# Patient Record
Sex: Male | Born: 1960 | Race: White | Hispanic: No | Marital: Married | Smoking: Never smoker
Health system: Southern US, Community
[De-identification: ages and names within clinical notes are randomized; demographics above are authoritative.]

## PROBLEM LIST (undated history)

## (undated) DIAGNOSIS — C189 Malignant neoplasm of colon, unspecified: Secondary | ICD-10-CM

## (undated) DIAGNOSIS — I10 Essential (primary) hypertension: Secondary | ICD-10-CM

## (undated) DIAGNOSIS — L723 Sebaceous cyst: Secondary | ICD-10-CM

## (undated) HISTORY — DX: Essential (primary) hypertension: I10

## (undated) HISTORY — DX: Malignant neoplasm of colon, unspecified: C18.9

## (undated) HISTORY — DX: Sebaceous cyst: L72.3

## (undated) HISTORY — PX: OTHER SURGICAL HISTORY: SHX169

---

## 1990-03-27 HISTORY — PX: COLON SURGERY: SHX602

## 1998-03-27 HISTORY — PX: KNEE SURGERY: SHX244

## 2008-06-16 ENCOUNTER — Encounter (INDEPENDENT_AMBULATORY_CARE_PROVIDER_SITE_OTHER): Payer: Self-pay | Admitting: *Deleted

## 2009-05-28 ENCOUNTER — Telehealth: Payer: Self-pay | Admitting: Gastroenterology

## 2009-06-08 ENCOUNTER — Encounter (INDEPENDENT_AMBULATORY_CARE_PROVIDER_SITE_OTHER): Payer: Self-pay | Admitting: *Deleted

## 2009-06-08 ENCOUNTER — Ambulatory Visit: Payer: Self-pay | Admitting: Gastroenterology

## 2009-07-23 ENCOUNTER — Telehealth (INDEPENDENT_AMBULATORY_CARE_PROVIDER_SITE_OTHER): Payer: Self-pay | Admitting: *Deleted

## 2009-07-27 ENCOUNTER — Ambulatory Visit: Payer: Self-pay | Admitting: Gastroenterology

## 2010-04-26 NOTE — Letter (Signed)
Summary: [facility] Instructions  [facility]  [address] [place], [place] [address]   Phone: [phone]  Fax: [phone]       Paul [name]    27-Dec-1960    MRN: [id number]        Procedure Day Dorna Bloom:  Jake Shark  07/06/09     Arrival Time:  9:00AM     Procedure Time:  10:00AM     Location of Procedure:                    _ X_  [facility] (4th Floor)                        PREPARATION FOR COLONOSCOPY WITH MOVIPREP   Starting 5 days prior to your procedure 07/01/09 do not eat nuts, seeds, popcorn, corn, beans, peas,  salads, or any raw vegetables.  Do not take any fiber supplements (e.g. Metamucil, Citrucel, and Benefiber).  THE DAY BEFORE YOUR PROCEDURE         DATE: 07/05/09  DAY: MONDAY  1.  Drink clear liquids the entire day-NO SOLID FOOD  2.  Do not drink anything colored red or purple.  Avoid juices with pulp.  No orange juice.  3.  Drink at least 64 oz. (8 glasses) of fluid/clear liquids during the day to prevent dehydration and help the prep work efficiently.  CLEAR LIQUIDS INCLUDE: Water Jello Ice Popsicles Tea (sugar ok, no milk/cream) Powdered fruit flavored drinks Coffee (sugar ok, no milk/cream) Gatorade Juice: apple, white grape, white cranberry  Lemonade Clear bullion, consomm, broth Carbonated beverages (any kind) Strained chicken noodle soup Hard Candy                             4.  In the morning, mix first dose of MoviPrep solution:    Empty 1 Pouch A and 1 Pouch B into the disposable container    Add lukewarm drinking water to the top line of the container. Mix to dissolve    Refrigerate (mixed solution should be used within 24 hrs)  5.  Begin drinking the prep at 5:00 p.m. The MoviPrep container is divided by 4 marks.   Every 15 minutes drink the solution down to the next mark (approximately 8 oz) until the full liter is complete.   6.  Follow completed prep with 16 oz of clear liquid of your choice (Nothing  red or purple).  Continue to drink clear liquids until bedtime.  7.  Before going to bed, mix second dose of MoviPrep solution:    Empty 1 Pouch A and 1 Pouch B into the disposable container    Add lukewarm drinking water to the top line of the container. Mix to dissolve    Refrigerate  THE DAY OF YOUR PROCEDURE      DATE: 07/06/09  DAY: TUESDAY  Beginning at 5:00AM (5 hours before procedure):         1. Every 15 minutes, drink the solution down to the next mark (approx 8 oz) until the full liter is complete.  2. Follow completed prep with 16 oz. of clear liquid of your choice.    3. You may drink clear liquids until 8:00AM (2 HOURS BEFORE PROCEDURE).   MEDICATION INSTRUCTIONS  Unless otherwise instructed, you should take regular prescription medications with a small sip of water   as early as possible the morning  of your procedure.   Additional medication instructions: TAKE YOUR LISINOPRIL THE MORNING OF PROCEDURE.         OTHER INSTRUCTIONS  You will need a responsible adult at least 50 years of age to accompany you and drive you home.   This person must remain in the waiting room during your procedure.  Wear loose fitting clothing that is easily removed.  Leave jewelry and other valuables at home.  However, you may wish to bring a book to read or  an iPod/MP3 player to listen to music as you wait for your procedure to start.  Remove all body piercing jewelry and leave at home.  Total time from sign-in until discharge is approximately 2-3 hours.  You should go home directly after your procedure and rest.  You can resume normal activities the  day after your procedure.  The day of your procedure you should not:   Drive   Make legal decisions   Operate machinery   Drink alcohol   Return to work  You will receive specific instructions about eating, activities and medications before you leave.    The above instructions have been reviewed and explained to  me by   Wyona Almas RN  June 08, 2009 4:15 PM     I fully understand and can verbalize these instructions _____________________________ Date _________

## 2010-04-26 NOTE — Miscellaneous (Signed)
Summary: [facility] Previsit/prep  Clinical Lists Changes  Medications: Added new medication of MOVIPREP 100 GM  SOLR (PEG-KCL-NACL-NASULF-NA ASC-C) As per prep instructions. - Signed Rx of MOVIPREP 100 GM  SOLR (PEG-KCL-NACL-NASULF-NA ASC-C) As per prep instructions.;  #1 x 0;  Signed;  Entered by: Wyona Almas RN;  Authorized by: Louis Meckel MD;  Method used: Electronically to [facility] [phone]*, [address], [place], [place]  [address], Ph: [phone] or [phone], Fax: [phone] Observations: Added new observation of NKA: T (06/08/2009 15:51)    Prescriptions: MOVIPREP 100 GM  SOLR (PEG-KCL-NACL-NASULF-NA ASC-C) As per prep instructions.  #1 x 0   Entered by:   Wyona Almas RN   Authorized by:   Louis Meckel MD   Signed by:   Wyona Almas RN on 06/08/2009   Method used:   [facility]ronically to        [facility] [phone]* (retail)       [address]       [place], [place]  [address]       Ph: [phone] or [phone]       Fax: [phone]   RxID:   [id number]

## 2010-04-26 NOTE — Progress Notes (Signed)
Summary: Questions about Prep  Phone Note Call from Patient Call back at [phone]   Caller: Patient Call For: Dr. Arlyce Dice Reason for Call: Talk to Nurse Summary of Call: pt wants to know if he should take all his meds before procedure and if he can start his Moviprep around 5:45 the day before Initial call taken by: Karna Christmas,  July 23, 2009 8:30 AM  Follow-up for Phone Call        pt's questions were answered about meds.  told pt it would be okay to start prep at 5:45. Follow-up by: Ezra Sites RN,  July 23, 2009 9:47 AM

## 2010-04-26 NOTE — Progress Notes (Signed)
Summary: Schedule recall colonoscopy  Phone Note Outgoing Call Call back at [facility] Phone [phone]   Call placed by: Christie Nottingham CMA Duncan Dull),  May 28, 2009 10:18 AM Call placed to: Patient Summary of Call: Left message for pt to call back and schedule recall colonoscopy.  Initial call taken by: Christie Nottingham CMA Duncan Dull),  May 28, 2009 10:18 AM  Follow-up for Phone Call        left message for pt  to call back  Follow-up by: Christie Nottingham CMA Duncan Dull),  June 04, 2009 9:15 AM     Appended Document: Schedule recall colonoscopy patient scheduled for 06/17/2009, previsit on 06/08/2009 went over everything on the phone too soon to mail a letter.

## 2010-04-26 NOTE — Procedures (Signed)
Summary: Colonoscopy  Patient: Paul [name] Note: All result statuses are Final unless otherwise noted.  Tests: (1) Colonoscopy (COL)   COL Colonoscopy           DONE     [facility]     [address]     [place], [place]  [address]           COLONOSCOPY PROCEDURE REPORT           PATIENT:  Paul [name], Paul [name]  MR#:  [id number]     BIRTHDATE:  08-21-1960, 48 yrs. old  GENDER:  male           ENDOSCOPIST:  Barbette Hair. Arlyce Dice, MD     Referred by:           PROCEDURE DATE:  07/27/2009     PROCEDURE:  Diagnostic Colonoscopy     ASA CLASS:  Class II     INDICATIONS:  1) screening  2) history of colon cancer           MEDICATIONS:   Fentanyl 100 mcg IV, Versed 10 mg IV           DESCRIPTION OF PROCEDURE:   After the risks benefits and     alternatives of the procedure were thoroughly explained, informed     consent was obtained.  Digital rectal exam was performed and     revealed no abnormalities.   The LB CF-[id number] endoscope     was introduced through the anus and advanced to the anastamosis,     without limitations.  The quality of the prep was excellent, using     MoviPrep.  The instrument was then slowly withdrawn as the colon     was fully examined.     <<PROCEDUREIMAGES>>           FINDINGS:  A normal appearing cecum, ileocecal valve, and     appendiceal orifice were identified. The ascending, hepatic     flexure, transverse, splenic flexure, descending, sigmoid colon,     and rectum appeared unremarkable (see image2, image3, image5,     image6, image7, image8, image9, and image11).   Retroflexed views     in the rectum revealed no abnormalities.    The time to     anastamosis =  2.5  minutes. The scope was then withdrawn (time =     6.45  min) from the patient and the procedure completed.           COMPLICATIONS:  None           ENDOSCOPIC IMPRESSION:     1) Normal colon 9s/p right hemicolectomy)     RECOMMENDATIONS:     1) Colonoscopy           REPEAT EXAM:   In 5 year(s) for Colonoscopy.           ______________________________     Barbette Hair. Arlyce Dice, MD           CC: Foye Deer, MD           n.     Rosalie Doctor:   Barbette Hair. Kaplan at 07/27/2009 10:54 AM           Marcie Bal, [id number]  Note: An exclamation mark (!) indicates a result that was not dispersed into the flowsheet. Document Creation Date: 07/27/2009 10:54 AM _______________________________________________________________________  (1) Order result status: Final Collection or observation date-time: 07/27/2009 10:48 Requested date-time:  Receipt date-time:  Reported date-time:  Referring Physician:   Ordering Physician: Melvia Heaps [phone]) Specimen Source:  Source: Launa Grill Order Number: [id number] Lab site:   Appended Document: Colonoscopy    Clinical Lists Changes  Observations: Added new observation of COLONNXTDUE: 07/2014 (07/27/2009 13:28)

## 2012-08-14 ENCOUNTER — Ambulatory Visit (INDEPENDENT_AMBULATORY_CARE_PROVIDER_SITE_OTHER): Payer: Managed Care, Other (non HMO) | Admitting: General Surgery

## 2012-09-05 ENCOUNTER — Encounter (INDEPENDENT_AMBULATORY_CARE_PROVIDER_SITE_OTHER): Payer: Self-pay | Admitting: General Surgery

## 2012-09-05 ENCOUNTER — Encounter (INDEPENDENT_AMBULATORY_CARE_PROVIDER_SITE_OTHER): Payer: Self-pay

## 2012-09-05 ENCOUNTER — Ambulatory Visit (INDEPENDENT_AMBULATORY_CARE_PROVIDER_SITE_OTHER): Payer: Managed Care, Other (non HMO) | Admitting: General Surgery

## 2012-09-05 VITALS — BP 124/78 | HR 72 | Temp 98.0°F | Resp 18 | Ht 66.0 in | Wt 170.0 lb

## 2012-09-05 DIAGNOSIS — L723 Sebaceous cyst: Secondary | ICD-10-CM

## 2012-09-05 NOTE — Progress Notes (Signed)
Chief complaint: Multiple scalp cysts  History: Patient is well known to me due to a large GIST the terminal ileum resected in 1996. He has done well from that surgery would notice recurrence. For a number of years he has had several cysts on his scalp that occasionally opened up and drained. They seem to be getting gradually larger. He interested in having these removed. No current infection or pain.  Past Medical History  Diagnosis Date  . Sebaceous cyst   . Hypertension    Past Surgical History  Procedure Laterality Date  . Colon surgery  1992  . Knee surgery  2000   Current Outpatient Prescriptions  Medication Sig Dispense Refill  . Aspirin-Acetaminophen-Caffeine (EXCEDRIN PO) Take by mouth as needed.      . cholestyramine (QUESTRAN) 4 GM/DOSE powder daily. 2 scoops daily      . fish oil-omega-3 fatty acids 1000 MG capsule Take 1 g by mouth daily.      . hydrochlorothiazide (MICROZIDE) 12.5 MG capsule Take 12.5 mg by mouth daily.      Marland Kitchen ibuprofen (ADVIL,MOTRIN) 200 MG tablet Take 200 mg by mouth every 6 (six) hours as needed for pain.      Marland Kitchen lisinopril (PRINIVIL,ZESTRIL) 20 MG tablet       . Multiple Vitamin (MULTIVITAMIN) tablet Take 1 tablet by mouth daily.       No current facility-administered medications for this visit.   No Known Allergies Exam: BP 124/78  Pulse 72  Temp(Src) 98 F (36.7 C) (Oral)  Resp 18  Ht 5\' 6"  (1.676 m)  Wt 170 lb (77.111 kg)  BMI 27.45 kg/m2 General: Well-appearing Caucasian male HEENT: He has several firm but typical feeling sebaceous cyst of the scalp. There is a 2 cm cyst on the right anterior the scalp, a 3.5 cm cyst on the left posterior scalp and a 1 cm cyst on the right posterior scalp. Lungs: Clear good breath sounds without wheezing Cardiac: Regular rate and rhythm without murmurs. No edema Abdomen: Well-healed midline incision. No hernias. No masses or tenderness.  Assessment and plan: Multiple sebaceous cysts of the scalp with  some enlargement and intermittent drainage. He would like these removed which I think would be best. We discussed use of local anesthesia with sedation and risks of bleeding and infection. We'll schedule this at his convenience.

## 2012-10-09 DIAGNOSIS — Q828 Other specified congenital malformations of skin: Secondary | ICD-10-CM

## 2012-10-09 DIAGNOSIS — L7211 Pilar cyst: Secondary | ICD-10-CM

## 2012-10-24 ENCOUNTER — Ambulatory Visit (INDEPENDENT_AMBULATORY_CARE_PROVIDER_SITE_OTHER): Payer: Managed Care, Other (non HMO) | Admitting: General Surgery

## 2012-10-24 ENCOUNTER — Encounter (INDEPENDENT_AMBULATORY_CARE_PROVIDER_SITE_OTHER): Payer: Self-pay | Admitting: General Surgery

## 2012-10-24 VITALS — BP 120/78 | HR 56 | Resp 16 | Ht 66.0 in | Wt 170.0 lb

## 2012-10-24 DIAGNOSIS — Z09 Encounter for follow-up examination after completed treatment for conditions other than malignant neoplasm: Secondary | ICD-10-CM

## 2012-10-24 NOTE — Progress Notes (Signed)
History: Patient returns following excision of 3 sebaceous cysts of the scalp. He reports no problems.  Exam: Incisions are all well healed without infection or other complication. Sutures are removed.  Assessment plan: Doing well following excision of multiple scalp sebaceous cysts as above. Return as needed.

## 2013-01-10 ENCOUNTER — Emergency Department (HOSPITAL_COMMUNITY)
Admission: EM | Admit: 2013-01-10 | Discharge: 2013-01-11 | Disposition: A | Discharge status: Home / Self Care | Payer: Managed Care, Other (non HMO) | Attending: Emergency Medicine | Admitting: Emergency Medicine

## 2013-01-10 ENCOUNTER — Encounter (HOSPITAL_COMMUNITY): Payer: Self-pay | Admitting: Emergency Medicine

## 2013-01-10 DIAGNOSIS — Z79899 Other long term (current) drug therapy: Secondary | ICD-10-CM | POA: Insufficient documentation

## 2013-01-10 DIAGNOSIS — Y9389 Activity, other specified: Secondary | ICD-10-CM | POA: Insufficient documentation

## 2013-01-10 DIAGNOSIS — I10 Essential (primary) hypertension: Secondary | ICD-10-CM | POA: Insufficient documentation

## 2013-01-10 DIAGNOSIS — IMO0002 Reserved for concepts with insufficient information to code with codable children: Secondary | ICD-10-CM | POA: Insufficient documentation

## 2013-01-10 DIAGNOSIS — R296 Repeated falls: Secondary | ICD-10-CM | POA: Insufficient documentation

## 2013-01-10 DIAGNOSIS — R55 Syncope and collapse: Secondary | ICD-10-CM | POA: Insufficient documentation

## 2013-01-10 DIAGNOSIS — Y92009 Unspecified place in unspecified non-institutional (private) residence as the place of occurrence of the external cause: Secondary | ICD-10-CM | POA: Insufficient documentation

## 2013-01-10 DIAGNOSIS — Z872 Personal history of diseases of the skin and subcutaneous tissue: Secondary | ICD-10-CM | POA: Insufficient documentation

## 2013-01-10 DIAGNOSIS — R42 Dizziness and giddiness: Secondary | ICD-10-CM | POA: Insufficient documentation

## 2013-01-10 DIAGNOSIS — I951 Orthostatic hypotension: Secondary | ICD-10-CM

## 2013-01-10 LAB — POCT I-STAT, CHEM 8
BUN: 13 mg/dL (ref 6–23)
Calcium, Ion: 1.23 mmol/L (ref 1.12–1.23)
Chloride: 101 mEq/L (ref 96–112)
Creatinine, Ser: 1.2 mg/dL (ref 0.50–1.35)
Glucose, Bld: 109 mg/dL — ABNORMAL HIGH (ref 70–99)
HCT: 37 % — ABNORMAL LOW (ref 39.0–52.0)
Hemoglobin: 12.6 g/dL — ABNORMAL LOW (ref 13.0–17.0)
Potassium: 3.4 mEq/L — ABNORMAL LOW (ref 3.5–5.1)
Sodium: 137 mEq/L (ref 135–145)
TCO2: 19 mmol/L (ref 0–100)

## 2013-01-10 NOTE — ED Notes (Addendum)
Pt was at a friend's house playing a game standing up, he bent at the waist and as he stood straight up he became dizzy, fell and lost consciousness for approximately 30 seconds per wife.  Pt states he had 3 beers this evening. A&Ox4.
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
## 2013-01-13 ENCOUNTER — Emergency Department (HOSPITAL_COMMUNITY): Payer: No Typology Code available for payment source

## 2013-01-13 ENCOUNTER — Emergency Department (HOSPITAL_COMMUNITY)
Admission: EM | Admit: 2013-01-13 | Discharge: 2013-01-13 | Disposition: A | Discharge status: Home / Self Care | Payer: No Typology Code available for payment source | Attending: Emergency Medicine | Admitting: Emergency Medicine

## 2013-01-13 ENCOUNTER — Encounter (HOSPITAL_COMMUNITY): Payer: Self-pay | Admitting: Emergency Medicine

## 2013-01-13 DIAGNOSIS — Y939 Activity, unspecified: Secondary | ICD-10-CM | POA: Insufficient documentation

## 2013-01-13 DIAGNOSIS — Z79899 Other long term (current) drug therapy: Secondary | ICD-10-CM | POA: Insufficient documentation

## 2013-01-13 DIAGNOSIS — Y92410 Unspecified street and highway as the place of occurrence of the external cause: Secondary | ICD-10-CM | POA: Insufficient documentation

## 2013-01-13 DIAGNOSIS — S20219A Contusion of unspecified front wall of thorax, initial encounter: Secondary | ICD-10-CM | POA: Insufficient documentation

## 2013-01-13 DIAGNOSIS — I10 Essential (primary) hypertension: Secondary | ICD-10-CM | POA: Insufficient documentation

## 2013-01-13 DIAGNOSIS — S161XXA Strain of muscle, fascia and tendon at neck level, initial encounter: Secondary | ICD-10-CM

## 2013-01-13 DIAGNOSIS — R209 Unspecified disturbances of skin sensation: Secondary | ICD-10-CM | POA: Insufficient documentation

## 2013-01-13 DIAGNOSIS — S139XXA Sprain of joints and ligaments of unspecified parts of neck, initial encounter: Secondary | ICD-10-CM | POA: Insufficient documentation

## 2013-01-13 MED ORDER — HYDROCODONE-ACETAMINOPHEN 5-325 MG PO TABS: 1.0000 | ORAL_TABLET | Freq: Four times a day (QID) | ORAL | Status: DC | PRN

## 2013-01-13 MED ORDER — IBUPROFEN 800 MG PO TABS
800.0000 mg | ORAL_TABLET | Freq: Once | ORAL | Status: AC
  Administered 2013-01-13: 800 mg via ORAL
  Filled 2013-01-13: qty 1

## 2013-01-13 NOTE — ED Notes (Signed)
MD Plunkett at bedside. 
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
## 2013-01-13 NOTE — ED Notes (Signed)
Patient transported to X-ray 
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
## 2013-01-13 NOTE — ED Notes (Signed)
Pt discharged home, alert and ambulatory upon discharge, 1 new RX prescribed, pt verbalizes understanding of discharge instructions, pt driven home by spouse, no narcotics given in ED

## 2013-01-13 NOTE — ED Notes (Signed)
Pt returned from XR and CT, placed back on continuous pulse ox and BP cycle every 30 mins, NAD

## 2013-10-28 ENCOUNTER — Encounter: Payer: Self-pay | Admitting: Gastroenterology

## 2014-06-12 ENCOUNTER — Encounter: Payer: Self-pay | Admitting: *Deleted

## 2014-06-22 ENCOUNTER — Encounter: Payer: Self-pay | Admitting: Gastroenterology

## 2015-03-16 ENCOUNTER — Ambulatory Visit (INDEPENDENT_AMBULATORY_CARE_PROVIDER_SITE_OTHER)
Admission: RE | Admit: 2015-03-16 | Discharge: 2015-03-16 | Disposition: A | Discharge status: Home / Self Care | Payer: Managed Care, Other (non HMO) | Source: Ambulatory Visit | Attending: Family Medicine | Admitting: Family Medicine

## 2015-03-16 ENCOUNTER — Other Ambulatory Visit (INDEPENDENT_AMBULATORY_CARE_PROVIDER_SITE_OTHER): Payer: Managed Care, Other (non HMO)

## 2015-03-16 ENCOUNTER — Ambulatory Visit (INDEPENDENT_AMBULATORY_CARE_PROVIDER_SITE_OTHER): Payer: Managed Care, Other (non HMO) | Admitting: Family Medicine

## 2015-03-16 ENCOUNTER — Encounter: Payer: Self-pay | Admitting: Family Medicine

## 2015-03-16 VITALS — BP 128/80 | HR 78 | Ht 65.0 in | Wt 167.0 lb

## 2015-03-16 DIAGNOSIS — M25561 Pain in right knee: Secondary | ICD-10-CM

## 2015-03-16 DIAGNOSIS — M25461 Effusion, right knee: Secondary | ICD-10-CM | POA: Diagnosis not present

## 2015-03-16 NOTE — Progress Notes (Signed)
[name] [place], [place] [address] Phone: [phone] Subjective:    I'm seeing this patient by the request  of:  Gennette Pac, MD   CC: Right knee pain  [id number] Paul [name] is a 54 y.o. male coming in with complaint of right knee pain. Patient was running over the Thanksgiving weekend. States that he did have a step [place] causing discomfort in his knee. Had significant amount pain for the next 72 hours where he had decreased range of motion. Now patient has been able to do more activity. Continues to workout as well as running 2-3 times a week but does have discomfort. Seems to be worse when he is going downstairs. Denies any giving out on him. Denies any radiation or weakness. Rates the severity of pain a 6 out of 10. Does respond well to anti-inflammatories orally.  Past Medical History  Diagnosis Date  . Sebaceous cyst   . Hypertension   . Colon cancer ([place])     1992   Past Surgical History  Procedure Laterality Date  . Colon surgery  1992  . Knee surgery  2000  . Tumor removed     Social History  Substance Use Topics  . Smoking status: Never Smoker   . Smokeless tobacco: None  . Alcohol Use: 1.8 oz/week    3 Cans of beer per week     Comment: per week   No Known Allergies No family history on file. nothing pertinent.      Past medical history, social, surgical and family history all reviewed in electronic medical record.   Review of Systems: No headache, visual changes, nausea, vomiting, diarrhea, constipation, dizziness, abdominal pain, skin rash, fevers, chills, night sweats, weight loss, swollen lymph nodes, body aches, joint swelling, muscle aches, chest pain, shortness of breath, mood changes.   Objective Blood pressure 128/80, pulse 78, height 5\' 5"  (1.651 m), weight 167 lb (75.751 kg), SpO2 99 %.  General: No apparent distress alert and oriented x3 mood and affect normal, dressed  appropriately.  HEENT: Pupils equal, extraocular movements intact  Respiratory: Patient's speak in full sentences and does not appear short of breath  Cardiovascular: No lower extremity edema, non tender, no erythema  Skin: Warm dry intact with no signs of infection or rash on extremities or on axial skeleton.  Abdomen: Soft nontender  Neuro: Cranial nerves II through XII are intact, neurovascularly intact in all extremities with 2+ DTRs and 2+ pulses.  Lymph: No lymphadenopathy of posterior or anterior cervical chain or axillae bilaterally.  Gait normal with good balance and coordination.  MSK:  Non tender with full range of motion and good stability and symmetric strength and tone of shoulders, elbows, wrist, hip, and ankles bilaterally.  Knee: Right knee Normal to inspection with no erythema or effusion or obvious bony abnormalities. Mild tenderness to palpation over the lateral joint line as well as over the superior lateral aspect of the patella ROM full in flexion and extension and lower leg rotation. Ligaments with solid consistent endpoints including ACL, PCL, LCL, MCL. Equivocal Mcmurray's, Apley's, and Thessalonian tests. Mild painful patellar compression. Patellar glide with moderate crepitus. Patellar and quadriceps tendons unremarkable. Hamstring and quadriceps strength is normal.  Contralateral knee unremarkable  MSK US performed of: right knee.  This study was ordered, performed, and interpreted by Charlann Boxer D.O.  Knee: All structures visualized. Patient does have a posterior lateral meniscectomy with post surgical changes.  Patient does have narrowing of the lateral joint line. Patient also has a knee effusion of the suprapatellar pouch. Questionable loose body seen in the suprapatellar pouch.  Patellar Tendon unremarkable on long and transverse views without effusion. No abnormality of prepatellar bursa. LCL and MCL unremarkable on long and transverse views. No  abnormality of origin of medial or lateral head of the gastrocnemius.  IMPRESSION:  Knee effusion with synovits.      Impression and Recommendations:     This case required medical decision making of moderate complexity.

## 2015-03-16 NOTE — Assessment & Plan Note (Signed)
Patient did have a trial of aspiration today. Patient had more of a synovitis though noted. Unable to get significant amount of fluid out. Patient did respond well to the injection. We discussed home exercises, icing, as well as oral anti-inflammatories for the next 6 days. We discussed with patient that differential includes patient's underlying arthritis secondary to his remote history of meniscectomy when he is a teenager, and more likely a patellar subluxation. I do not see any new signs of any new meniscal injury but with patient having a synovitis we'll consider further workup if he does not make improvement. Patient given a brace to wear with activity. Patient and will come back and see me again in 3-4 weeks. Depending on that we will discuss formal physical therapy versus possible advance imaging.

## 2015-03-16 NOTE — Progress Notes (Signed)
Pre visit review using our clinic review tool, if applicable. No additional management support is needed unless otherwise documented below in the visit note. 

## 2015-03-16 NOTE — Patient Instructions (Signed)
Good to see you Ice 20 minutes 2 times daily. Usually after activity and before bed. Duexis 3 times daily for 6 days.  Wear brace with activity or exercise.  Vitamin D 2000 IU daily Turmeric 500mg  twice daily Exercises 3 times a week.  Xrays of right knee today in the basement  See me again in 3-4 weeks.

## 2015-04-05 ENCOUNTER — Encounter: Payer: Self-pay | Admitting: Family Medicine

## 2015-04-05 ENCOUNTER — Other Ambulatory Visit (INDEPENDENT_AMBULATORY_CARE_PROVIDER_SITE_OTHER): Payer: Managed Care, Other (non HMO)

## 2015-04-05 ENCOUNTER — Ambulatory Visit (INDEPENDENT_AMBULATORY_CARE_PROVIDER_SITE_OTHER): Payer: Managed Care, Other (non HMO) | Admitting: Family Medicine

## 2015-04-05 VITALS — BP 136/84 | HR 65 | Ht 65.0 in | Wt 167.0 lb

## 2015-04-05 DIAGNOSIS — M25461 Effusion, right knee: Secondary | ICD-10-CM | POA: Diagnosis not present

## 2015-04-05 DIAGNOSIS — M129 Arthropathy, unspecified: Secondary | ICD-10-CM | POA: Diagnosis not present

## 2015-04-05 DIAGNOSIS — M1711 Unilateral primary osteoarthritis, right knee: Secondary | ICD-10-CM

## 2015-04-05 NOTE — Patient Instructions (Signed)
Good to see you Ice is your friend  Keep doing the exercises 2-3 times a week Start running 1-2 times a week for 2 weeks then up too you how much.  I do like lower impact exercises as well OK to do boot camp but drop weight 50% and increase 15% a week See me again in 6 weeks if not perfect

## 2015-04-05 NOTE — Assessment & Plan Note (Signed)
Results at this time. We discussed icing regimen and home exercises. We discussed which activities to do an which was potentially avoid. Patient given an exercise prescription and a return to running protocol. Patient will avoid certain activities such as twisting motions for another 6 weeks. Continuing to wear the brace with running for another 6 weeks. Patient will come back and see me again in 6 weeks if pain is not completely resolved. Underlying arthritis could give him flares from time to time.

## 2015-04-05 NOTE — Assessment & Plan Note (Signed)
Patient does have moderate narrowing of the lateral joint line. We will need to monitor this over time. Patient will continue with the conservative therapy including vitamin D supplementation.

## 2015-04-05 NOTE — Progress Notes (Signed)
[name] [place], [place] [address] Phone: [phone] Subjective:      CC: Right knee pain f/u  [id number] Paul [name] is a 55 y.o. male coming in with complaint of right knee pain. Patient was running over the Thanksgiving weekend. Had swelling of the right knee. X-rays show that patient had moderate arthritis. These images were independently visualized again by me today. Patient elected do home exercises, icing, anti-inflammatories. Patient states he is a proximal milling 90% better. Patient states that he is no longer having the severe pain anymore. States that the swelling seemed to resolve after the first 1-2 weeks. Patient continues to wear the brace fairly regularly. Avoid any running but has been doing the elliptical as well as the biking. No pain with that activity. Continues to wear the brace.  Past Medical History  Diagnosis Date  . Sebaceous cyst   . Hypertension   . Colon cancer ([place])     1992   Past Surgical History  Procedure Laterality Date  . Colon surgery  1992  . Knee surgery  2000  . Tumor removed     Social History  Substance Use Topics  . Smoking status: Never Smoker   . Smokeless tobacco: Not on file  . Alcohol Use: 1.8 oz/week    3 Cans of beer per week     Comment: per week   No Known Allergies No family history on file. nothing pertinent as related to the chief complaint      Past medical history, social, surgical and family history all reviewed in electronic medical record.   Review of Systems: No headache, visual changes, nausea, vomiting, diarrhea, constipation, dizziness, abdominal pain, skin rash, fevers, chills, night sweats, weight loss, swollen lymph nodes, body aches, joint swelling, muscle aches, chest pain, shortness of breath, mood changes.   Objective There were no vitals taken for this visit.  General: No apparent distress alert and oriented x3 mood and affect normal, dressed  appropriately.  HEENT: Pupils equal, extraocular movements intact  Respiratory: Patient's speak in full sentences and does not appear short of breath  Cardiovascular: No lower extremity edema, non tender, no erythema  Skin: Warm dry intact with no signs of infection or rash on extremities or on axial skeleton.  Abdomen: Soft nontender  Neuro: Cranial nerves II through XII are intact, neurovascularly intact in all extremities with 2+ DTRs and 2+ pulses.  Lymph: No lymphadenopathy of posterior or anterior cervical chain or axillae bilaterally.  Gait normal with good balance and coordination.  MSK:  Non tender with full range of motion and good stability and symmetric strength and tone of shoulders, elbows, wrist, hip, and ankles bilaterally.  Knee: Right knee Normal to inspection with no erythema or effusion or obvious bony abnormalities. Nontender on exam ROM full in flexion and extension and lower leg rotation. Ligaments with solid consistent endpoints including ACL, PCL, LCL, MCL. Mild positive Mcmurray's, Apley's, and Thessalonian tests but improved from previous exam. Negative patellar compression. Patellar glide with moderate crepitus. Patellar and quadriceps tendons unremarkable. Hamstring and quadriceps strength is normal.  Contralateral knee unremarkable  MSK US performed of: right knee.  This study was ordered, performed, and interpreted by Charlann Boxer D.O.  Knee: All structures visualized. Patient does have a posterior lateral meniscectomy with post surgical changes. Patient does have mild narrowing of the lateral joint line. No significant effusion noted. Seems to be resolved at this time. Patellar Tendon unremarkable on  long and transverse views without effusion. No abnormality of prepatellar bursa. LCL and MCL unremarkable on long and transverse views. No abnormality of origin of medial or lateral head of the gastrocnemius.  IMPRESSION:  Resolved effusion and synovitis.       Impression and Recommendations:     This case required medical decision making of moderate complexity.

## 2015-04-05 NOTE — Progress Notes (Signed)
Pre visit review using our clinic review tool, if applicable. No additional management support is needed unless otherwise documented below in the visit note. 

## 2015-04-08 ENCOUNTER — Ambulatory Visit: Payer: Managed Care, Other (non HMO) | Admitting: Family Medicine

## 2015-05-18 ENCOUNTER — Ambulatory Visit: Payer: Managed Care, Other (non HMO) | Admitting: Family Medicine

## 2015-08-13 ENCOUNTER — Other Ambulatory Visit: Payer: Self-pay

## 2015-08-13 ENCOUNTER — Encounter: Payer: Self-pay | Admitting: Family Medicine

## 2015-08-13 ENCOUNTER — Ambulatory Visit (INDEPENDENT_AMBULATORY_CARE_PROVIDER_SITE_OTHER): Payer: Managed Care, Other (non HMO) | Admitting: Family Medicine

## 2015-08-13 VITALS — BP 122/76 | HR 72 | Ht 65.0 in | Wt 171.0 lb

## 2015-08-13 DIAGNOSIS — S83241A Other tear of medial meniscus, current injury, right knee, initial encounter: Secondary | ICD-10-CM

## 2015-08-13 DIAGNOSIS — M25561 Pain in right knee: Secondary | ICD-10-CM

## 2015-08-13 MED ORDER — MELOXICAM 15 MG PO TABS: 15.0000 mg | ORAL_TABLET | Freq: Every day | ORAL | Status: AC

## 2015-08-13 NOTE — Patient Instructions (Signed)
Good to see you Paul [name] 20 minutes 2 times daily. Usually after activity and before bed. Exercises 3 times a week.  OK to bike, elliptical more then running if you can  Absolutely no twisting motions.  Wear brace with exercise meloxicam daily for 10 days then as needed. See me again in 3 weeks to make sure you are doing well.

## 2015-08-13 NOTE — Assessment & Plan Note (Signed)
New medial tear.  No displacement, will try conservative therapy  Discussed icing.  Meloxicam prescribed.  Patient continues with the bracing. We discussed which activities to avoid. Patient will come back and see me again in 3 weeks. If worsening symptoms would consider injection and formal physical therapy.

## 2015-08-13 NOTE — Progress Notes (Signed)
Pre visit review using our clinic review tool, if applicable. No additional management support is needed unless otherwise documented below in the visit note. 

## 2015-08-13 NOTE — Progress Notes (Signed)
[name] [place], [place] [address] Phone: [phone] Subjective:      CC: Right knee pain f/u  [id number] Paul [name] is a 55 y.o. male coming in with complaint of right knee pain. Patient was seen greater than 4 months ago. Patient did have a resolving effusion was found to have underlying osteophytic changes and moderate severity. Was doing well. Was doing a bootcamp class in and freshly had a sharp pain in his right knee. This is 48 hours ago. Since and continues to have a dull throbbing aching sensation. Patient states that if he does any type of twisting motion and seems to be worse. Patient has a race in 2 weeks and is wondering if he is going to be able to do it. Patient has tried some Aleve and ice which has helped. No clicking locking or giving out on him.  Past Medical History  Diagnosis Date  . Sebaceous cyst   . Hypertension   . Colon cancer ([place])     1992   Past Surgical History  Procedure Laterality Date  . Colon surgery  1992  . Knee surgery  2000  . Tumor removed     Social History  Substance Use Topics  . Smoking status: Never Smoker   . Smokeless tobacco: None  . Alcohol Use: 1.8 oz/week    3 Cans of beer per week     Comment: per week   No Known Allergies No family history on file. nothing pertinent as related to the chief complaint      Past medical history, social, surgical and family history all reviewed in electronic medical record.   Review of Systems: No headache, visual changes, nausea, vomiting, diarrhea, constipation, dizziness, abdominal pain, skin rash, fevers, chills, night sweats, weight loss, swollen lymph nodes, body aches, joint swelling, muscle aches, chest pain, shortness of breath, mood changes.   Objective Blood pressure 122/76, pulse 72, height 5\' 5"  (1.651 m), weight 171 lb (77.565 kg), SpO2 97 %.  General: No apparent distress alert and oriented x3 mood and affect normal, dressed  appropriately.  HEENT: Pupils equal, extraocular movements intact  Respiratory: Patient's speak in full sentences and does not appear short of breath  Cardiovascular: No lower extremity edema, non tender, no erythema  Skin: Warm dry intact with no signs of infection or rash on extremities or on axial skeleton.  Abdomen: Soft nontender  Neuro: Cranial nerves II through XII are intact, neurovascularly intact in all extremities with 2+ DTRs and 2+ pulses.  Lymph: No lymphadenopathy of posterior or anterior cervical chain or axillae bilaterally.  Gait normal with good balance and coordination.  MSK:  Non tender with full range of motion and good stability and symmetric strength and tone of shoulders, elbows, wrist, hip, and ankles bilaterally.  Knee: Right knee Normal to inspection with no erythema or effusion or obvious bony abnormalities. Severely tender over the medial joint line ROM full in flexion and extension and lower leg rotation. Ligaments with solid consistent endpoints including ACL, PCL, LCL, MCL. positive Mcmurray's, Apley's, and Thessalonian tests Negative patellar compression. Patellar glide with moderate crepitus. Patellar and quadriceps tendons unremarkable. Hamstring and quadriceps strength is normal.  Contralateral knee unremarkable  MSK US performed of: right knee.  This study was ordered, performed, and interpreted by Charlann Boxer D.O.  Knee: All structures visualized. Continued during of the lateral and medial joint lines. Patient does have an acute medial meniscal tear of the  anterior aspect. No displacement. Large toe. Patellar Tendon unremarkable on long and transverse views without effusion. No abnormality of prepatellar bursa. LCL and MCL unremarkable on long and transverse views. No abnormality of origin of medial or lateral head of the gastrocnemius. Saved pictures in internal hard drive  IMPRESSION:  New medial meniscal tear     Impression and  Recommendations:     This case required medical decision making of moderate complexity.

## 2015-09-03 ENCOUNTER — Encounter: Payer: Self-pay | Admitting: Family Medicine

## 2015-09-03 ENCOUNTER — Ambulatory Visit (INDEPENDENT_AMBULATORY_CARE_PROVIDER_SITE_OTHER): Payer: Managed Care, Other (non HMO) | Admitting: Family Medicine

## 2015-09-03 VITALS — BP 132/82 | HR 72 | Ht 65.0 in | Wt 167.0 lb

## 2015-09-03 DIAGNOSIS — M129 Arthropathy, unspecified: Secondary | ICD-10-CM

## 2015-09-03 DIAGNOSIS — M1711 Unilateral primary osteoarthritis, right knee: Secondary | ICD-10-CM

## 2015-09-03 DIAGNOSIS — S83241D Other tear of medial meniscus, current injury, right knee, subsequent encounter: Secondary | ICD-10-CM | POA: Diagnosis not present

## 2015-09-03 NOTE — Assessment & Plan Note (Signed)
Doing very well at this time. I do not see any signs of internal derangement. We will continue with conservative therapy. Patient will follow-up with me again on more on an as-needed basis.

## 2015-09-03 NOTE — Progress Notes (Signed)
Pre visit review using our clinic review tool, if applicable. No additional management support is needed unless otherwise documented below in the visit note. 

## 2015-09-03 NOTE — Assessment & Plan Note (Signed)
Patient does have fairly significant arthritis of the knee especially of the medial compartment. We discussed with patient at possible injections will be needed as well as other braces. Patient thinks he is doing well. Will follow-up as needed.

## 2015-09-03 NOTE — Progress Notes (Signed)
  [name] [place], [place] [address] Phone: [phone] Subjective:      CC: Right knee pain f/u  [id number] Paul [name] is a 55 y.o. male coming in with complaint of right knee pain. She was found to have a meniscal tear. Has been doing the home exercises, icing and wearing the brace. Overall has been making improvement. No longer having the swelling. Does have known underlying arthritis of the knee as well. No locking or giving out on him. Has started increasing his activity and even started running again with no significant pain.  Past Medical History  Diagnosis Date  . Sebaceous cyst   . Hypertension   . Colon cancer ([place])     1992   Past Surgical History  Procedure Laterality Date  . Colon surgery  1992  . Knee surgery  2000  . Tumor removed     Social History  Substance Use Topics  . Smoking status: Never Smoker   . Smokeless tobacco: None  . Alcohol Use: 1.8 oz/week    3 Cans of beer per week     Comment: per week   No Known Allergies No family history on file. nothing pertinent as related to the chief complaint      Past medical history, social, surgical and family history all reviewed in electronic medical record.   Review of Systems: No headache, visual changes, nausea, vomiting, diarrhea, constipation, dizziness, abdominal pain, skin rash, fevers, chills, night sweats, weight loss, swollen lymph nodes, body aches, joint swelling, muscle aches, chest pain, shortness of breath, mood changes.   Objective Blood pressure 132/82, pulse 72, height 5\' 5"  (1.651 m), weight 167 lb (75.751 kg), SpO2 98 %.  General: No apparent distress alert and oriented x3 mood and affect normal, dressed appropriately.  HEENT: Pupils equal, extraocular movements intact  Respiratory: Patient's speak in full sentences and does not appear short of breath  Cardiovascular: No lower extremity edema, non tender, no erythema  Skin: Warm dry  intact with no signs of infection or rash on extremities or on axial skeleton.  Abdomen: Soft nontender  Neuro: Cranial nerves II through XII are intact, neurovascularly intact in all extremities with 2+ DTRs and 2+ pulses.  Lymph: No lymphadenopathy of posterior or anterior cervical chain or axillae bilaterally.  Gait normal with good balance and coordination.  MSK:  Non tender with full range of motion and good stability and symmetric strength and tone of shoulders, elbows, wrist, hip, and ankles bilaterally.  Knee: Right knee Normal to inspection with no erythema or effusion or obvious bony abnormalities. Minimal tender over the medial joint line ROM full in flexion and extension and lower leg rotation. Ligaments with solid consistent endpoints including ACL, PCL, LCL, MCL. Negative Mcmurray's, Apley's, and Thessalonian tests Negative patellar compression. Patellar glide with moderate crepitus. Patellar and quadriceps tendons unremarkable. Hamstring and quadriceps strength is normal.  Contralateral knee unremarkable       Impression and Recommendations:     This case required medical decision making of moderate complexity.

## 2016-03-17 ENCOUNTER — Ambulatory Visit (INDEPENDENT_AMBULATORY_CARE_PROVIDER_SITE_OTHER): Payer: Managed Care, Other (non HMO)

## 2016-03-17 ENCOUNTER — Ambulatory Visit (INDEPENDENT_AMBULATORY_CARE_PROVIDER_SITE_OTHER): Payer: Managed Care, Other (non HMO) | Admitting: Emergency Medicine

## 2016-03-17 VITALS — BP 122/72 | HR 78 | Temp 98.3°F | Resp 17 | Ht 66.5 in | Wt 166.0 lb

## 2016-03-17 DIAGNOSIS — J189 Pneumonia, unspecified organism: Secondary | ICD-10-CM

## 2016-03-17 DIAGNOSIS — R05 Cough: Secondary | ICD-10-CM | POA: Diagnosis not present

## 2016-03-17 DIAGNOSIS — J181 Lobar pneumonia, unspecified organism: Secondary | ICD-10-CM | POA: Diagnosis not present

## 2016-03-17 DIAGNOSIS — R059 Cough, unspecified: Secondary | ICD-10-CM

## 2016-03-17 LAB — POCT CBC
Granulocyte percent: 67.3 %G (ref 37–80)
HCT, POC: 40.6 % — AB (ref 43.5–53.7)
Hemoglobin: 14.4 g/dL (ref 14.1–18.1)
Lymph, poc: 1.1 (ref 0.6–3.4)
MCH, POC: 32.3 pg — AB (ref 27–31.2)
MCHC: 35.6 g/dL — AB (ref 31.8–35.4)
MCV: 90.7 fL (ref 80–97)
MID (cbc): 0.4 (ref 0–0.9)
MPV: 6.2 fL (ref 0–99.8)
POC Granulocyte: 3.1 (ref 2–6.9)
POC LYMPH PERCENT: 24.3 %L (ref 10–50)
POC MID %: 8.4 %M (ref 0–12)
Platelet Count, POC: 150 10*3/uL (ref 142–424)
RBC: 4.48 M/uL — AB (ref 4.69–6.13)
RDW, POC: 12.4 %
WBC: 4.6 10*3/uL (ref 4.6–10.2)

## 2016-03-17 MED ORDER — AZITHROMYCIN 250 MG PO TABS: ORAL_TABLET | ORAL | 0 refills | Status: DC

## 2016-03-17 MED ORDER — CEFTRIAXONE SODIUM 1 G IJ SOLR
1.0000 g | Freq: Once | INTRAMUSCULAR | Status: AC
  Administered 2016-03-17: 1 g via INTRAMUSCULAR

## 2016-03-17 MED ORDER — BENZONATATE 100 MG PO CAPS: 100.0000 mg | ORAL_CAPSULE | Freq: Three times a day (TID) | ORAL | 0 refills | Status: DC | PRN

## 2016-03-17 NOTE — Patient Instructions (Addendum)
IF you received an x-ray today, you will receive an invoice from [facility]. Please contact [facility] at [phone] with questions or concerns regarding your invoice.   IF you received labwork today, you will receive an invoice from [place]. Please contact [facility] at [phone] with questions or concerns regarding your invoice.   Our billing staff will not be able to assist you with questions regarding bills from these companies.  You will be contacted with the lab results as soon as they are available. The fastest way to get your results is to activate your My Chart account. Instructions are located on the last page of this paperwork. If you have not heard from [place] regarding the results in 2 weeks, please contact this office.     pneumonia Community-Acquired Pneumonia, Adult Pneumonia is an infection of the lungs. There are different types of pneumonia. One type can develop while a person is in a hospital. A different type, called community-acquired pneumonia, develops in people who are not, or have not recently been, in the hospital or other health care facility. What are the causes? Pneumonia may be caused by bacteria, viruses, or funguses. Community-acquired pneumonia is often caused by Streptococcus pneumonia bacteria. These bacteria are often passed from one person to another by breathing in droplets from the cough or sneeze of an infected person. What increases the risk? The condition is more likely to develop in:  People who havechronic diseases, such as chronic obstructive pulmonary disease (COPD), asthma, congestive heart failure, cystic fibrosis, diabetes, or kidney disease.  People who haveearly-stage or late-stage HIV.  People who havesickle cell disease.  People who havehad their spleen removed (splenectomy).  People who havepoor Human resources officer.  People who havemedical conditions that increase the risk of breathing in (aspirating)  secretions their own mouth and nose.  People who havea weakened immune system (immunocompromised).  People who smoke.  People whotravel to areas where pneumonia-causing germs commonly exist.  People whoare around animal habitats or animals that have pneumonia-causing germs, including birds, bats, rabbits, cats, and farm animals. What are the signs or symptoms? Symptoms of this condition include:  Adry cough.  A wet (productive) cough.  Fever.  Sweating.  Chest pain, especially when breathing deeply or coughing.  Rapid breathing or difficulty breathing.  Shortness of breath.  Shaking chills.  Fatigue.  Muscle aches. How is this diagnosed? Your health care provider will take a medical history and perform a physical exam. You may also have other tests, including:  Imaging studies of your chest, including X-rays.  Tests to check your blood oxygen level and other blood gases.  Other tests on blood, mucus (sputum), fluid around your lungs (pleural fluid), and urine. If your pneumonia is severe, other tests may be done to identify the specific cause of your illness. How is this treated? The type of treatment that you receive depends on many factors, such as the cause of your pneumonia, the medicines you take, and other medical conditions that you have. For most adults, treatment and recovery from pneumonia may occur at home. In some cases, treatment must happen in a hospital. Treatment may include:  Antibiotic medicines, if the pneumonia was caused by bacteria.  Antiviral medicines, if the pneumonia was caused by a virus.  Medicines that are given by mouth or through an IV tube.  Oxygen.  Respiratory therapy. Although rare, treating severe pneumonia may include:  Mechanical ventilation. This is done if you are not breathing well on your  own and you cannot maintain a safe blood oxygen level.  Thoracentesis. This procedureremoves fluid around one lung or both lungs  to help you breathe better. Follow these instructions at home:  Take over-the-counter and prescription medicines only as told by your health care provider.  Only takecough medicine if you are losing sleep. Understand that cough medicine can prevent your body's natural ability to remove mucus from your lungs.  If you were prescribed an antibiotic medicine, take it as told by your health care provider. Do not stop taking the antibiotic even if you start to feel better.  Sleep in a semi-upright position at night. Try sleeping in a reclining chair, or place a few pillows under your head.  Do not use tobacco products, including cigarettes, chewing tobacco, and e-cigarettes. If you need help quitting, ask your health care provider.  Drink enough water to keep your urine clear or pale yellow. This will help to thin out mucus secretions in your lungs. How is this prevented? There are ways that you can decrease your risk of developing community-acquired pneumonia. Consider getting a pneumococcal vaccine if:  You are older than 55 years of age.  You are older than 55 years of age and are undergoing cancer treatment, have chronic lung disease, or have other medical conditions that affect your immune system. Ask your health care provider if this applies to you. There are different types and schedules of pneumococcal vaccines. Ask your health care provider which vaccination option is best for you. You may also prevent community-acquired pneumonia if you take these actions:  Get an influenza vaccine every year. Ask your health care provider which type of influenza vaccine is best for you.  Go to the dentist on a regular basis.  Wash your hands often. Use hand sanitizer if soap and water are not available. Contact a health care provider if:  You have a fever.  You are losing sleep because you cannot control your cough with cough medicine. Get help right away if:  You have worsening shortness of  breath.  You have increased chest pain.  Your sickness becomes worse, especially if you are an older adult or have a weakened immune system.  You cough up blood. This information is not intended to replace advice given to you by your health care provider. Make sure you discuss any questions you have with your health care provider. Document Released: 03/13/2005 Document Revised: 07/22/2015 Document Reviewed: 07/08/2014 Elsevier Interactive Patient Education  2017 [employer].

## 2016-03-17 NOTE — Progress Notes (Addendum)
Patient ID: Paul [name], male   DOB: 1960-09-22, 55 y.o.   MRN: [id number]    By signing my name below, I, Essence Howell, attest that this documentation has been prepared under the direction and in the presence of Darlyne Russian, MD Electronically Signed: Ladene Artist, ED Scribe 03/17/2016 at 10:08 AM.  Chief Complaint:  Chief Complaint  Patient presents with  . Cough  . URI   HPI: Paul [name] is a 55 y.o. male who reports to [facility] today complaining of a minimally productive cough for the past 3 days. He further describes a gradually improving "deep" cough. Pt reports associated symptoms of mild rhinorrhea, mild dry and sore throat, and rib cage soreness with coughing. He states that his wife has been ill with similar symptoms. He denies chest pain and sob. Pt denies tobacco use. No h/o bronchitis or PNA. Pt is currently taking Lisinopril for HTN.   Past Medical History:  Diagnosis Date  . Colon cancer ([place])    1992  . Hypertension   . Sebaceous cyst    Past Surgical History:  Procedure Laterality Date  . COLON SURGERY  1992  . KNEE SURGERY  2000  . tumor removed     Social History   Social History  . Marital status: Married    Spouse name: N/A  . Number of children: N/A  . Years of education: N/A   Social History Main Topics  . Smoking status: Never Smoker  . Smokeless tobacco: Never Used  . Alcohol use 1.8 oz/week    3 Cans of beer per week     Comment: per week  . Drug use: No  . Sexual activity: No   Other Topics Concern  . None   Social History Narrative  . None   No family history on file. No Known Allergies Prior to Admission medications   Medication Sig Start Date End Date Taking? Authorizing Provider  aspirin-acetaminophen-caffeine (EXCEDRIN MIGRAINE) [phone] MG per tablet Take 1 tablet by mouth every 6 (six) hours as needed for pain.   Yes Historical Provider, MD  cholestyramine Lucrezia Starch) 4 GM/DOSE powder Take 2 g by mouth every morning.  06/21/12   Yes Historical Provider, MD  hydrochlorothiazide (MICROZIDE) 12.5 MG capsule Take 12.5 mg by mouth every morning.    Yes Historical Provider, MD  lisinopril (PRINIVIL,ZESTRIL) 20 MG tablet Take 20 mg by mouth 2 (two) times daily.  08/24/12  Yes Historical Provider, MD  meloxicam (MOBIC) 15 MG tablet Take 1 tablet (15 mg total) by mouth daily. 08/13/15  Yes Lyndal Pulley, DO   ROS: The patient denies fevers, chills, night sweats, unintentional weight loss, chest pain, palpitations, wheezing, dyspnea on exertion, nausea, vomiting, abdominal pain, dysuria, hematuria, melena, numbness, weakness, or tingling.   All other systems have been reviewed and were otherwise negative with the exception of those mentioned in the HPI and as above.    PHYSICAL EXAM: Vitals:   03/17/16 0912  BP: 122/72  Pulse: 78  Resp: 17  Temp: 98.3 F (36.8 C)   Body mass index is 26.39 kg/m.  General: Alert, no acute distress HEENT:  Normocephalic, atraumatic, oropharynx patent. Cerumen in both ears. Throat is normal  Eye: EOMI, [facility] Cardiovascular:  Regular rate and rhythm, no rubs murmurs or gallops.  No Carotid bruits, radial pulse intact. No pedal edema.  Respiratory: Clear to auscultation bilaterally.  No wheezes, rales, or rhonchi.  No cyanosis, no use of accessory musculature.  Abdominal: No organomegaly, abdomen  is soft and non-tender, positive bowel sounds.  No masses. Healed midline scar.  Musculoskeletal: Gait intact. No edema. Discomfort in lower costal margins.  Skin: No rashes. Neurologic: Facial musculature symmetric. Psychiatric: Patient acts appropriately throughout our interaction. Lymphatic: No cervical or submandibular lymphadenopathy  LABS: Results for orders placed or performed in visit on 03/17/16  POCT CBC  Result Value Ref Range   WBC 4.6 4.6 - 10.2 K/uL   Lymph, poc 1.1 0.6 - 3.4   POC LYMPH PERCENT 24.3 10 - 50 %L   MID (cbc) 0.4 0 - 0.9   POC MID % 8.4 0 - 12 %M   POC  Granulocyte 3.1 2 - 6.9   Granulocyte percent 67.3 37 - 80 %G   RBC 4.48 (A) 4.69 - 6.13 M/uL   Hemoglobin 14.4 14.1 - 18.1 g/dL   HCT, POC 40.6 (A) 43.5 - 53.7 %   MCV 90.7 80 - 97 fL   MCH, POC 32.3 (A) 27 - 31.2 pg   MCHC 35.6 (A) 31.8 - 35.4 g/dL   RDW, POC 12.4 %   Platelet Count, POC 150 142 - 424 K/uL   MPV 6.2 0 - 99.8 fL   Meds ordered this encounter  Medications  . cefTRIAXone (ROCEPHIN) injection 1 g  . azithromycin (ZITHROMAX) 250 MG tablet    Sig: Take 2 tabs PO x 1 dose, then 1 tab PO QD x 4 days    Dispense:  6 tablet    Refill:  0  . benzonatate (TESSALON) 100 MG capsule    Sig: Take 1-2 capsules (100-200 mg total) by mouth 3 (three) times daily as needed for cough.    Dispense:  40 capsule    Refill:  0   EKG/XRAY:   Primary read interpreted by Dr. Everlene Farrier at [facility]. Dg Chest 2 View  Result Date: 03/17/2016 CLINICAL DATA:  55 year old presenting with a four-day history of cough. EXAM: CHEST  2 VIEW COMPARISON:  None. FINDINGS: Cardiac silhouette normal in size. Thoracic aorta mildly atherosclerotic. Hilar and mediastinal contours otherwise unremarkable. Patchy airspace opacities involving the left lower lobe projecting just below the left hilum. Lungs otherwise clear. Bronchovascular markings normal. Pulmonary vascularity normal. No pleural effusions. Visualized bony thorax intact. IMPRESSION: 1. Acute pneumonia involving the left lower lobe. 2. Thoracic aortic atherosclerosis. Electronically Signed   By: Evangeline Dakin M.D.   On: 03/17/2016 10:32   ASSESSMENT/PLAN: Patient's white count is normal with platelets 150,000. Flu swab was sent off. This certainly could be an influenza type pneumonia. We'll cover with 1 g Rocephin and a Z-Pak. He is actually outside the window of taking Tamiflu. He is instructed to return to the emergency room if worsening over the next 48 hours. Otherwise will plan recheck on Tuesday.I personally performed the services described in this  documentation, which was scribed in my presence. The recorded information has been reviewed and is accurate.   Gross sideeffects, risk and benefits, and alternatives of medications d/w patient. Patient is aware that all medications have potential sideeffects and we are unable to predict every sideeffect or drug-drug interaction that may occur.  Arlyss Queen MD 03/17/2016 10:04 AM

## 2016-03-18 LAB — PLEASE NOTE:

## 2016-03-18 LAB — INFLUENZA A AND B
Influenza A Ag, EIA: NEGATIVE
Influenza B Ag, EIA: NEGATIVE

## 2016-03-21 ENCOUNTER — Ambulatory Visit (INDEPENDENT_AMBULATORY_CARE_PROVIDER_SITE_OTHER): Payer: Managed Care, Other (non HMO) | Admitting: Emergency Medicine

## 2016-03-21 VITALS — BP 112/74 | HR 64 | Temp 97.4°F | Resp 18 | Ht 66.5 in | Wt 166.0 lb

## 2016-03-21 DIAGNOSIS — J189 Pneumonia, unspecified organism: Secondary | ICD-10-CM

## 2016-03-21 DIAGNOSIS — R059 Cough, unspecified: Secondary | ICD-10-CM

## 2016-03-21 DIAGNOSIS — J181 Lobar pneumonia, unspecified organism: Secondary | ICD-10-CM | POA: Diagnosis not present

## 2016-03-21 DIAGNOSIS — R05 Cough: Secondary | ICD-10-CM

## 2016-03-21 NOTE — Patient Instructions (Addendum)
     IF you received an x-ray today, you will receive an invoice from [facility]. Please contact [facility] at [phone] with questions or concerns regarding your invoice.   IF you received labwork today, you will receive an invoice from [place]. Please contact [facility] at [phone] with questions or concerns regarding your invoice.   Our billing staff will not be able to assist you with questions regarding bills from these companies.  You will be contacted with the lab results as soon as they are available. The fastest way to get your results is to activate your My Chart account. Instructions are located on the last page of this paperwork. If you have not heard from [place] regarding the results in 2 weeks, please contact this office.    Continue present treatment. Able to resume regular activities. F/U here as needed. Community-Acquired Pneumonia, Adult Introduction Pneumonia is an infection of the lungs. One type of pneumonia can happen while a person is in a hospital. A different type can happen when a person is not in a hospital (community-acquired pneumonia). It is easy for this kind to spread from person to person. It can spread to you if you breathe near an infected person who coughs or sneezes. Some symptoms include:  A dry cough.  A wet (productive) cough.  Fever.  Sweating.  Chest pain. Follow these instructions at home:  Take over-the-counter and prescription medicines only as told by your doctor.  Only take cough medicine if you are losing sleep.  If you were prescribed an antibiotic medicine, take it as told by your doctor. Do not stop taking the antibiotic even if you start to feel better.  Sleep with your head and neck raised (elevated). You can do this by putting a few pillows under your head, or you can sleep in a recliner.  Do not use tobacco products. These include cigarettes, chewing tobacco, and e-cigarettes. If you need help quitting,  ask your doctor.  Drink enough water to keep your pee (urine) clear or pale yellow. A shot (vaccine) can help prevent pneumonia. Shots are often suggested for:  People older than 55 years of age.  People older than 55 years of age:  Who are having cancer treatment.  Who have long-term (chronic) lung disease.  Who have problems with their body's defense system (immune system). You may also prevent pneumonia if you take these actions:  Get the flu (influenza) shot every year.  Go to the dentist as often as told.  Wash your hands often. If soap and water are not available, use hand sanitizer. Contact a doctor if:  You have a fever.  You lose sleep because your cough medicine does not help. Get help right away if:  You are short of breath and it gets worse.  You have more chest pain.  Your sickness gets worse. This is very serious if:  You are an older adult.  Your body's defense system is weak.  You cough up blood. This information is not intended to replace advice given to you by your health care provider. Make sure you discuss any questions you have with your health care provider. Document Released: 08/30/2007 Document Revised: 08/19/2015 Document Reviewed: 07/08/2014  2017 Elsevier

## 2016-03-21 NOTE — Progress Notes (Signed)
Peter Scholle 55 y.o.   Chief Complaint  Patient presents with  . Follow-up    pneumonia    HISTORY OF PRESENT ILLNESS: This is a 55 y.o. male here for follow up of pneumonia; feels better and has no complaints. CXR reviewed by me with patient today.  HPI   Prior to Admission medications   Medication Sig Start Date End Date Taking? Authorizing Provider  aspirin-acetaminophen-caffeine (EXCEDRIN MIGRAINE) [phone] MG per tablet Take 1 tablet by mouth every 6 (six) hours as needed for pain.   Yes Historical Provider, MD  benzonatate (TESSALON) 100 MG capsule Take 1-2 capsules (100-200 mg total) by mouth 3 (three) times daily as needed for cough. 03/17/16  Yes Darlyne Russian, MD  cholestyramine Lucrezia Starch) 4 GM/DOSE powder Take 2 g by mouth every morning.  06/21/12  Yes Historical Provider, MD  hydrochlorothiazide (MICROZIDE) 12.5 MG capsule Take 12.5 mg by mouth every morning.    Yes Historical Provider, MD  lisinopril (PRINIVIL,ZESTRIL) 20 MG tablet Take 20 mg by mouth 2 (two) times daily.  08/24/12  Yes Historical Provider, MD  meloxicam (MOBIC) 15 MG tablet Take 1 tablet (15 mg total) by mouth daily. 08/13/15  Yes Lyndal Pulley, DO    No Known Allergies  Patient Active Problem List   Diagnosis Date Noted  . Acute medial meniscus tear of right knee 08/13/2015  . Arthritis of right knee 04/05/2015  . Effusion of right knee 03/16/2015    Past Medical History:  Diagnosis Date  . Colon cancer ([place])    1992  . Hypertension   . Sebaceous cyst     Past Surgical History:  Procedure Laterality Date  . COLON SURGERY  1992  . KNEE SURGERY  2000  . tumor removed      Social History   Social History  . Marital status: Married    Spouse name: N/A  . Number of children: N/A  . Years of education: N/A   Occupational History  . Not on file.   Social History Main Topics  . Smoking status: Never Smoker  . Smokeless tobacco: Never Used  . Alcohol use 1.8 oz/week    3 Cans of  beer per week     Comment: per week  . Drug use: No  . Sexual activity: No   Other Topics Concern  . Not on file   Social History Narrative  . No narrative on file    History reviewed. No pertinent family history.   Review of Systems  Constitutional: Negative.   HENT: Negative.   Eyes: Negative.   Respiratory: Negative.   Cardiovascular: Negative.   Gastrointestinal: Negative.   Genitourinary: Negative.   Musculoskeletal: Negative.   Skin: Negative.   Neurological: Negative.   Endo/Heme/Allergies: Negative.   Psychiatric/Behavioral: Negative.    Vitals:   03/21/16 1054  BP: 112/74  Pulse: 64  Resp: 18  Temp: 97.4 F (36.3 C)     Physical Exam  Constitutional: He is oriented to person, place, and time. He appears well-developed and well-nourished.  HENT:  Head: Normocephalic and atraumatic.  Eyes: EOM are normal. Pupils are equal, round, and reactive to light.  Neck: Normal range of motion. Neck supple.  Cardiovascular: Normal rate, regular rhythm, normal heart sounds and intact distal pulses.   Pulmonary/Chest: Effort normal and breath sounds normal.  Abdominal: Soft. Bowel sounds are normal. There is no tenderness.  Musculoskeletal: Normal range of motion.  Neurological: He is alert and oriented to person, place,  and time.  Skin: Skin is warm and dry.     ASSESSMENT & PLAN: Azon was seen today for follow-up.  Diagnoses and all orders for this visit:  Community acquired pneumonia of left lower lobe of lung ([place]) -     Care order/instruction:  Cough -     Care order/instruction:  Pneumonia clinically much improved. Advised to continue and finish antibiotic. May resume regular activities as tolerated. Return if worse or any problems.    Agustina Caroli, MD [facility]

## 2016-03-22 ENCOUNTER — Encounter: Payer: Self-pay | Admitting: *Deleted

## 2017-03-29 ENCOUNTER — Ambulatory Visit: Payer: Self-pay

## 2017-03-29 ENCOUNTER — Encounter: Payer: Self-pay | Admitting: Family Medicine

## 2017-03-29 ENCOUNTER — Ambulatory Visit: Payer: Managed Care, Other (non HMO) | Admitting: Family Medicine

## 2017-03-29 VITALS — BP 126/70 | HR 71 | Ht 65.0 in | Wt 169.0 lb

## 2017-03-29 DIAGNOSIS — M24851 Other specific joint derangements of right hip, not elsewhere classified: Secondary | ICD-10-CM

## 2017-03-29 DIAGNOSIS — M25551 Pain in right hip: Secondary | ICD-10-CM

## 2017-03-29 MED ORDER — DICLOFENAC SODIUM 2 % TD SOLN: 2.0000 g | Freq: Two times a day (BID) | TRANSDERMAL | 3 refills | Status: DC

## 2017-03-29 NOTE — Progress Notes (Signed)
[name] [address] [place], [place] [address] Phone: [phone] Subjective:     CC: Right hip pain  QIO:[id number]  Paul [name] is a 57 y.o. male coming in with complaint of right hip pain. Patient states that the last couple of runs he started to have hip pain at mile 3. Most of his pain is over the greater trochanter. Denies any hip problems up until this point. He has been seen for arthritis of the right knee. The pain does go away after his runs. He does try to stretch  Onset- 2 weeks ago Location- right hip Duration- intermittent  Character- dull, achy Aggravating factors- running Reliving factors- not running Therapies tried- stretching Severity- 5/10     Past Medical History:  Diagnosis Date  . Colon cancer ([place])    1992  . Hypertension   . Sebaceous cyst    Past Surgical History:  Procedure Laterality Date  . COLON SURGERY  1992  . KNEE SURGERY  2000  . tumor removed     Social History   Socioeconomic History  . Marital status: Married    Spouse name: None  . Number of children: None  . Years of education: None  . Highest education level: None  Social Needs  . Financial resource strain: None  . Food insecurity - worry: None  . Food insecurity - inability: None  . Transportation needs - medical: None  . Transportation needs - non-medical: None  Occupational History  . None  Tobacco Use  . Smoking status: Never Smoker  . Smokeless tobacco: Never Used  Substance and Sexual Activity  . Alcohol use: Yes    Alcohol/week: 1.8 oz    Types: 3 Cans of beer per week    Comment: per week  . Drug use: No  . Sexual activity: No  Other Topics Concern  . None  Social History Narrative  . None   No Known Allergies No family history on file.  No autoimmune disease   Past medical history, social, surgical and family history all reviewed in electronic medical record.  No pertanent information unless stated regarding to the  chief complaint.   Review of Systems:Review of systems updated and as accurate as of 03/29/17  No headache, visual changes, nausea, vomiting, diarrhea, constipation, dizziness, abdominal pain, skin rash, fevers, chills, night sweats, weight loss, swollen lymph nodes, body aches, joint swelling, muscle aches, chest pain, shortness of breath, mood changes.   Objective  Blood pressure 126/70, pulse 71, height 5\' 5"  (1.651 m), weight 169 lb (76.7 kg), SpO2 98 %. Systems examined below as of 03/29/17   General: No apparent distress alert and oriented x3 mood and affect normal, dressed appropriately.  HEENT: Pupils equal, extraocular movements intact  Respiratory: Patient's speak in full sentences and does not appear short of breath  Cardiovascular: No lower extremity edema, non tender, no erythema  Skin: Warm dry intact with no signs of infection or rash on extremities or on axial skeleton.  Abdomen: Soft nontender  Neuro: Cranial nerves II through XII are intact, neurovascularly intact in all extremities with 2+ DTRs and 2+ pulses.  Lymph: No lymphadenopathy of posterior or anterior cervical chain or axillae bilaterally.  Gait normal with good balance and coordination.  MSK:  Non tender with full range of motion and good stability and symmetric strength and tone of shoulders, elbows, wrist, knee and ankles bilaterally.  Hip: Right ROM IR: 20 Deg, ER: 35 Deg, Flexion: 120  Deg, Extension: 100 Deg, Abduction: 73 Deg, Adduction: 15 Deg Strength IR: 5/5, ER: 5/5, Flexion: 5/5 mild pain, Extension: 5/5, Abduction: 5/5, Adduction: 5/5 Pelvic alignment unremarkable to inspection and palpation. Standing hip rotation and gait without trendelenburg sign / unsteadiness. Greater trochanter without tenderness to palpation. No tenderness over piriformis and greater trochanter. No pain with FABER or FADIR. Pain with resisted flexion No SI joint tenderness and normal minimal SI movement. Contralateral hip  unremarkable   [id number]; 15 additional minutes spent for Therapeutic exercises as stated in above notes.  This included exercises focusing on stretching, strengthening, with significant focus on eccentric aspects.   Long term goals include an improvement in range of motion, strength, endurance as well as avoiding reinjury. Patient's frequency would include in 1-2 times a day, 3-5 times a week for a duration of 6-12 weeks. Hip strengthening exercises which included:  Pelvic tilt/bracing to help with proper recruitment of the lower abs and pelvic floor muscles  Glute strengthening to properly contract glutes without over-engaging low back and hamstrings - prone hip extension and glute bridge exercises Proper stretching techniques to increase effectiveness for the hip flexors, groin, quads, piriformic and low back when appropriate    Proper technique shown and discussed handout in great detail with ATC.  All questions were discussed and answered.      Impression and Recommendations:     This case required medical decision making of moderate complexity.      Note: This dictation was prepared with Dragon dictation along with smaller phrase technology. Any transcriptional errors that result from this process are unintentional.

## 2017-03-29 NOTE — Assessment & Plan Note (Signed)
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  Has responded well previously aspect with the knee.  Given home exercises today, we discussed icing regimen, topical anti-inflammatories prescribed.  Patient will avoid any high impact exercising or any incline at this moment.  Follow-up again in 3 weeks.  At that time start a running progression.

## 2017-03-29 NOTE — Patient Instructions (Signed)
Good to see you  Snapping hip syndrome.  Ice 20 minutes 2 times daily. Usually after activity and before bed. Exercises 3 times a week.  pennsaid pinkie amount topically 2 times daily as needed.  No jumping, running or any incline See em again in 3-4 weeks and we will start you again on running progression

## 2017-04-18 NOTE — Progress Notes (Signed)
[name] [address] [place], [place] [address] Phone: [phone] Subjective:    I'm seeing this patient by the request  of:    CC: Pain follow-up  FTD:[id number]  Paul [name] is a 57 y.o. male coming in with complaint of hip pain.  Was found to have snapping hip syndrome..  Given home exercises and icing regimen and topical anti-inflammatories.  Patient wants to try this.  Patient states that he has been doing stretching and doing the arc trainer. He has not tried running yet. He is not having any pain with working out. He would like to try to get back to running. He requests physical therapy for when he does return to running so that someone can monitor his progress.      Past Medical History:  Diagnosis Date  . Colon cancer ([place])    1992  . Hypertension   . Sebaceous cyst    Past Surgical History:  Procedure Laterality Date  . COLON SURGERY  1992  . KNEE SURGERY  2000  . tumor removed     Social History   Socioeconomic History  . Marital status: Married    Spouse name: None  . Number of children: None  . Years of education: None  . Highest education level: None  Social Needs  . Financial resource strain: None  . Food insecurity - worry: None  . Food insecurity - inability: None  . Transportation needs - medical: None  . Transportation needs - non-medical: None  Occupational History  . None  Tobacco Use  . Smoking status: Never Smoker  . Smokeless tobacco: Never Used  Substance and Sexual Activity  . Alcohol use: Yes    Alcohol/week: 1.8 oz    Types: 3 Cans of beer per week    Comment: per week  . Drug use: No  . Sexual activity: No  Other Topics Concern  . None  Social History Narrative  . None   No Known Allergies No family history on file.  No family history of autoimmune disease   Past medical history, social, surgical and family history all reviewed in electronic medical record.  No pertanent information unless  stated regarding to the chief complaint.   Review of Systems:Review of systems updated and as accurate as of 04/19/17  No headache, visual changes, nausea, vomiting, diarrhea, constipation, dizziness, abdominal pain, skin rash, fevers, chills, night sweats, weight loss, swollen lymph nodes, body aches, joint swelling, muscle aches, chest pain, shortness of breath, mood changes.   Objective  Blood pressure 120/80, pulse 76, height 5\' 6"  (1.676 m), weight 169 lb (76.7 kg), SpO2 97 %. Systems examined below as of 04/19/17   General: No apparent distress alert and oriented x3 mood and affect normal, dressed appropriately.  HEENT: Pupils equal, extraocular movements intact  Respiratory: Patient's speak in full sentences and does not appear short of breath  Cardiovascular: No lower extremity edema, non tender, no erythema  Skin: Warm dry intact with no signs of infection or rash on extremities or on axial skeleton.  Abdomen: Soft nontender  Neuro: Cranial nerves II through XII are intact, neurovascularly intact in all extremities with 2+ DTRs and 2+ pulses.  Lymph: No lymphadenopathy of posterior or anterior cervical chain or axillae bilaterally.  Gait normal with good balance and coordination.  MSK:  Non tender with full range of motion and good stability and symmetric strength and tone of shoulders, elbows, wrist, knee and ankles bilaterally.  Hip: Right ROM IR: 25 Deg, ER: 45 Deg, Flexion: 120 Deg, Extension: 100 Deg, Abduction: 45 Deg, Adduction: 35 Deg Strength IR: 5/5, ER: 5/5, Flexion: 5/5, Extension: 5/5, Abduction: 4/5, Adduction: 5/5 Pelvic alignment unremarkable to inspection and palpation. Standing hip rotation and gait without trendelenburg sign / unsteadiness. Greater trochanter without tenderness to palpation. No tenderness over piriformis and greater trochanter. Minimal discomfort on the [facility] test. No SI joint tenderness and normal minimal SI movement.   Impression and  Recommendations:     This case required medical decision making of moderate complexity.      Note: This dictation was prepared with Dragon dictation along with smaller phrase technology. Any transcriptional errors that result from this process are unintentional.

## 2017-04-19 ENCOUNTER — Ambulatory Visit: Payer: Managed Care, Other (non HMO) | Admitting: Family Medicine

## 2017-04-19 ENCOUNTER — Encounter: Payer: Self-pay | Admitting: Family Medicine

## 2017-04-19 VITALS — BP 120/80 | HR 76 | Ht 66.0 in | Wt 169.0 lb

## 2017-04-19 DIAGNOSIS — M24851 Other specific joint derangements of right hip, not elsewhere classified: Secondary | ICD-10-CM

## 2017-04-19 NOTE — Patient Instructions (Signed)
Good to see you We will get you in with PT Start a walk-run progression: 3 day max a week - Initially - Run 2 mins, then walk 1 min. -Then run 3 mins, and walk 1 min. -Then run 4 mins, and walk 1 min. -Then run 5 mins, and walk 1 min. -Slowly build up weekly to running 30 mins nonstop. OK to do boot camp but still modified for another 3 weeks    If painful at any of the steps, back up one step.  See me again in 4-6 weeks

## 2017-04-19 NOTE — Assessment & Plan Note (Signed)
Doing well at this time.  Discussed icing regimen and home exercises.  We discussed which activity to doing which wants to avoid.  We will start with formal physical therapy that I think will be beneficial.  We discussed some running progression at this time.  Worsening symptoms we will monitor but I think patient will do well with conservative therapy.

## 2017-05-01 ENCOUNTER — Encounter: Payer: Self-pay | Admitting: Physical Therapy

## 2017-05-01 ENCOUNTER — Ambulatory Visit (INDEPENDENT_AMBULATORY_CARE_PROVIDER_SITE_OTHER): Payer: Managed Care, Other (non HMO) | Admitting: Physical Therapy

## 2017-05-01 DIAGNOSIS — M25551 Pain in right hip: Secondary | ICD-10-CM

## 2017-05-01 DIAGNOSIS — R2689 Other abnormalities of gait and mobility: Secondary | ICD-10-CM

## 2017-05-02 NOTE — Therapy (Signed)
[facility] [address] [place], [place], [address] Phone: [phone]   Fax:  [phone]  Physical Therapy Evaluation  Patient Details  Name: Paul [name] MRN: [id number] Date of Birth: [birth year] Referring Provider: Charlann Boxer   Encounter Date: 05/01/2017  PT End of Session - 05/01/17 1648    Visit Number  1    Number of Visits  12    Date for PT Re-Evaluation  06/12/17    PT Start Time  1603    PT Stop Time  1646    PT Time Calculation (min)  43 min    Activity Tolerance  Patient tolerated treatment well    Behavior During Therapy  [facility] for tasks assessed/performed       Past Medical History:  Diagnosis Date  . Colon cancer ([place])    1992  . Hypertension   . Sebaceous cyst     Past Surgical History:  Procedure Laterality Date  . COLON SURGERY  1992  . KNEE SURGERY  2000  . tumor removed      There were no vitals filed for this visit.   Subjective Assessment - 05/01/17 1607    Subjective  Pt states increased pain in R hip/lateral, that started around the holidays.  He did take a month off, and had decreased pain. He has started back running some, intervals, would like to work up to running 15 miles. He works in [employer], production., sits, stands, walks.     Currently in Pain?  Yes    Pain Score  3     Pain Location  Hip    Pain Orientation  Right;Lateral    Pain Descriptors / Indicators  Sore;Tightness    Pain Type  Acute pain    Pain Onset  More than a month ago    Pain Frequency  Intermittent    Aggravating Factors   Increased standing, walking, running.     Pain Relieving Factors  Stretching, rest          [facility] PT Assessment - 05/02/17 1332      Assessment   Medical Diagnosis  R hip pain    Referring Provider  Charlann Boxer    Prior Therapy  None      Precautions   Precautions  None      Restrictions   Weight Bearing Restrictions  No      Balance Screen   Has the patient fallen in the past 6 months  No       Prior Function   Level of Independence  Independent      Cognition   Overall Cognitive Status  Within Functional Limits for tasks assessed      Posture/Postural Control   Posture Comments  Standing: ASIS and PSIS appear level,  Walking, pt with R trunk lean; Supine: L LE appears slightly longer than R.       ROM / Strength   AROM / PROM / Strength  AROM;Strength      AROM   Overall AROM Comments  Hip ROM: mild limitations;  Ankle DF: significant limitation bilaterally; Knee: WNL      Strength   Overall Strength Comments  Hip: 4- to 4/5 ; Knee: 4+/5      Palpation   Palpation comment  Tenderness to R ITB; mild hypomobility for hip IR;  Significant limitations in HS.              Objective measurements completed on examination: See above findings.  [facility] Adult PT Treatment/Exercise - 05/02/17 0001      Exercises   Exercises  Knee/Hip      Knee/Hip Exercises: Stretches   Active Hamstring Stretch  3 reps;30 seconds    Active Hamstring Stretch Limitations  seated    ITB Stretch  3 reps;30 seconds    ITB Stretch Limitations  Supine with strap    Piriformis Stretch  3 reps;30 seconds    Piriformis Stretch Limitations  Hip IR across body    Other Knee/Hip Stretches  ITB standing 30 sec x2    Other Knee/Hip Stretches  Education for tennis ball roll on ITB             PT Education - 05/01/17 1648    Education provided  Yes    Education Details  HEP, Ice, heat,     Person(s) Educated  Patient    Methods  Explanation;Demonstration;Handout    Comprehension  Verbalized understanding;Returned demonstration;Verbal cues required       PT Short Term Goals - 05/02/17 1317      PT SHORT TERM GOAL #1   Baseline  Pt to be independent with initial HEP     Time  2    Period  Weeks    Target Date  05/15/17      PT SHORT TERM GOAL #2   Title  Pt to report decreased pain to 2/10 with activity     Time  2    Period  Weeks    Status  New    Target Date   05/15/17        PT Long Term Goals - 05/02/17 1320      PT LONG TERM GOAL #1   Title  Pt to report decreased pain to 0-2/10 with activity     Time  6    Period  Weeks    Status  New    Target Date  06/12/17      PT LONG TERM GOAL #2   Title  Pt to demo improved strength of Bil hips, to at least 4+/5 to improve stability and pain     Time  6    Period  Weeks    Status  New    Target Date  06/12/17      PT LONG TERM GOAL #3   Title  Pt to demo running mechanics to be [facility] to improve pain     Time  6    Period  Weeks    Status  New    Target Date  06/12/17             Plan - 05/02/17 1322    Clinical Impression Statement  Pt presents with primary complaint of increased pain in R hip/gute region, for several months. He has tenderness to palpate greater trochanter and entire ITB. He has muslce weakness in B hips and core, and decreased stability with SLS and dynamic movement. He has significant tightness in hamstrings, quads, and hip flexors. He has mild postural asymmetry with ambulation, with R trunk lean, and likely leg length deficit. He has decreased ability for full functional activities, IADLS and community activities, due to pain. Pt to benefit from skilled PT to improve deficits and pain.     Clinical Presentation  Stable    Clinical Decision Making  Low    Rehab Potential  Good    PT Frequency  2x / week    PT Duration  6 weeks  PT Treatment/Interventions  ADLs/Self Care Home Management;Cryotherapy;Electrical Stimulation;Iontophoresis 4mg /ml Dexamethasone;Functional mobility training;Stair training;Gait training;Ultrasound;Moist Heat;Therapeutic activities;Therapeutic exercise;Balance training;Neuromuscular re-education;Patient/family education;Passive range of motion;Orthotic Fit/Training;Manual techniques;Dry needling;Taping    PT Next Visit Plan  Run on T-Mill, short to long test for pelvis     Consulted and Agree with Plan of Care  Patient       Patient will  benefit from skilled therapeutic intervention in order to improve the following deficits and impairments:  Abnormal gait, Decreased endurance, Hypomobility, Decreased strength, Pain, Difficulty walking, Decreased mobility, Decreased balance, Decreased range of motion, Improper body mechanics, Postural dysfunction, Impaired flexibility  Visit Diagnosis: Pain in right hip  Other abnormalities of gait and mobility     Problem List Patient Active Problem List   Diagnosis Date Noted  . 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 Acute medial meniscus tear of right knee 08/13/2015  . Arthritis of right knee 04/05/2015  . Effusion of right knee 03/16/2015    Lyndee Hensen, PT, DPT 1:34 PM  05/02/17   [place] [address], [place], [address] Phone: [phone]   Fax:  [phone]  Name: Paul [name] MRN: [id number] Date of Birth: [birth year]

## 2017-05-03 ENCOUNTER — Ambulatory Visit (INDEPENDENT_AMBULATORY_CARE_PROVIDER_SITE_OTHER): Payer: Managed Care, Other (non HMO) | Admitting: Physical Therapy

## 2017-05-03 ENCOUNTER — Encounter: Payer: Self-pay | Admitting: Physical Therapy

## 2017-05-03 DIAGNOSIS — M25551 Pain in right hip: Secondary | ICD-10-CM

## 2017-05-03 DIAGNOSIS — R2689 Other abnormalities of gait and mobility: Secondary | ICD-10-CM

## 2017-05-06 NOTE — Therapy (Signed)
[facility] [address] [place], [place], [address] Phone: [phone]   Fax:  [phone]  Physical Therapy Treatment  Patient Details  Name: Paul [name] MRN: [id number] Date of Birth: 20-Jul-1960 Referring Provider: Charlann Boxer   Encounter Date: 05/03/2017  PT End of Session - 05/06/17 1323    Visit Number  2    Number of Visits  12    Date for PT Re-Evaluation  06/12/17    PT Start Time  1602    PT Stop Time  1646    PT Time Calculation (min)  44 min    Activity Tolerance  Patient tolerated treatment well    Behavior During Therapy  [facility] for tasks assessed/performed       Past Medical History:  Diagnosis Date  . Colon cancer ([place])    1992  . Hypertension   . Sebaceous cyst     Past Surgical History:  Procedure Laterality Date  . COLON SURGERY  1992  . KNEE SURGERY  2000  . tumor removed      There were no vitals filed for this visit.                   [facility] Adult PT Treatment/Exercise - 05/06/17 1339      Knee/Hip Exercises: Stretches   Piriformis Stretch  3 reps;30 seconds    Piriformis Stretch Limitations  Seated    Other Knee/Hip Stretches  Hip IR across body 30 sec x3      Knee/Hip Exercises: Aerobic   Tread Mill  10 min, walk/jog 3 cycles       Knee/Hip Exercises: Standing   Hip Abduction  20 reps    Abduction Limitations  YTB    Hip Extension  20 reps    Extension Limitations  TYB      Knee/Hip Exercises: Sidelying   Hip ABduction  20 reps;Both    Clams  x20 bil      Manual Therapy   Manual Therapy  Soft tissue mobilization    Soft tissue mobilization  IASTM to R lateral hip and ITB;              PT Education - 05/06/17 1323    Education provided  Yes    Education Details  HEP    Person(s) Educated  Patient    Methods  Explanation    Comprehension  Verbalized understanding       PT Short Term Goals - 05/02/17 1317      PT SHORT TERM GOAL #1   Baseline  Pt to be  independent with initial HEP     Time  2    Period  Weeks    Target Date  05/15/17      PT SHORT TERM GOAL #2   Title  Pt to report decreased pain to 2/10 with activity     Time  2    Period  Weeks    Status  New    Target Date  05/15/17        PT Long Term Goals - 05/02/17 1320      PT LONG TERM GOAL #1   Title  Pt to report decreased pain to 0-2/10 with activity     Time  6    Period  Weeks    Status  New    Target Date  06/12/17      PT LONG TERM GOAL #2   Title  Pt to demo  improved strength of Bil hips, to at least 4+/5 to improve stability and pain     Time  6    Period  Weeks    Status  New    Target Date  06/12/17      PT LONG TERM GOAL #3   Title  Pt to demo running mechanics to be [facility] to improve pain     Time  6    Period  Weeks    Status  New    Target Date  06/12/17            Plan - 05/06/17 1324    Clinical Impression Statement  Gait assessed on treadmill today, at slower speeds (walking) Pt has noted hip drop on R and trunk lean to R. At higher speeds of jogging, deficit is less. Pt educated on strengthening for hips and glutes today, and importance of this for jogging. Manual therapy done for R lateral hip, for pain and tightness. Pt with decreased soreness in distal ITB region from previous visit. Pt to benefit from continued strengthening, he has noted weakness in R hip and glute compared to L with ther ex today.     Rehab Potential  Good    PT Frequency  2x / week    PT Duration  6 weeks    PT Treatment/Interventions  ADLs/Self Care Home Management;Cryotherapy;Electrical Stimulation;Iontophoresis 4mg /ml Dexamethasone;Functional mobility training;Stair training;Gait training;Ultrasound;Moist Heat;Therapeutic activities;Therapeutic exercise;Balance training;Neuromuscular re-education;Patient/family education;Passive range of motion;Orthotic Fit/Training;Manual techniques;Dry needling;Taping    PT Next Visit Plan  Run on T-Mill, short to long test for  pelvis     Consulted and Agree with Plan of Care  Patient       Patient will benefit from skilled therapeutic intervention in order to improve the following deficits and impairments:  Abnormal gait, Decreased endurance, Hypomobility, Decreased strength, Pain, Difficulty walking, Decreased mobility, Decreased balance, Decreased range of motion, Improper body mechanics, Postural dysfunction, Impaired flexibility  Visit Diagnosis: Pain in right hip  Other abnormalities of gait and mobility     Problem List Patient Active Problem List   Diagnosis Date Noted  . 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 Acute medial meniscus tear of right knee 08/13/2015  . Arthritis of right knee 04/05/2015  . Effusion of right knee 03/16/2015   Lyndee Hensen, PT, DPT 1:40 PM  05/06/17    [facility] [place], [place], [address] Phone: [phone]   Fax:  [phone]  Name: Paul [name] MRN: [id number] Date of Birth: Jan 15, 1961

## 2017-05-09 ENCOUNTER — Ambulatory Visit (INDEPENDENT_AMBULATORY_CARE_PROVIDER_SITE_OTHER): Payer: Managed Care, Other (non HMO) | Admitting: Physical Therapy

## 2017-05-09 DIAGNOSIS — M25551 Pain in right hip: Secondary | ICD-10-CM

## 2017-05-09 DIAGNOSIS — R2689 Other abnormalities of gait and mobility: Secondary | ICD-10-CM

## 2017-05-10 ENCOUNTER — Encounter: Payer: Self-pay | Admitting: Physical Therapy

## 2017-05-10 NOTE — Therapy (Signed)
[facility] [address] [place], [place], [address] Phone: [phone]   Fax:  [phone]  Physical Therapy Treatment  Patient Details  Name: Paul [name] MRN: [id number] Date of Birth: Nov 08, 1960 Referring Provider: Charlann Boxer   Encounter Date: 05/09/2017  PT End of Session - 05/10/17 0923    Visit Number  3    Number of Visits  12    Date for PT Re-Evaluation  06/12/17    PT Start Time  1525    PT Stop Time  1605    PT Time Calculation (min)  40 min    Activity Tolerance  Patient tolerated treatment well    Behavior During Therapy  [facility] for tasks assessed/performed       Past Medical History:  Diagnosis Date  . Colon cancer ([place])    1992  . Hypertension   . Sebaceous cyst     Past Surgical History:  Procedure Laterality Date  . COLON SURGERY  1992  . KNEE SURGERY  2000  . tumor removed      There were no vitals filed for this visit.  Subjective Assessment - 05/10/17 0922    Subjective  Pt states improved pain in hip and ITB region. He was able to run over the weekend with no pain. He has been doing HEP. He notes mild back pain today, from workout this am.     Limitations  Standing;Walking    Currently in Pain?  Yes    Pain Score  1     Pain Location  Hip    Pain Orientation  Right;Lateral    Pain Descriptors / Indicators  Tightness    Pain Type  Acute pain    Pain Onset  More than a month ago    Pain Frequency  Rarely                      [facility] Adult PT Treatment/Exercise - 05/09/17 1528      Knee/Hip Exercises: Stretches   Piriformis Stretch  3 reps;30 seconds    Piriformis Stretch Limitations  supine    Other Knee/Hip Stretches  --      Knee/Hip Exercises: Aerobic   Stationary Bike  L2 x 6 min       Knee/Hip Exercises: Standing   Hip Abduction  20 reps    Abduction Limitations  YTB    Hip Extension  20 reps    Extension Limitations  TYB    Functional Squat  20 reps    SLS  10 sec x5  bil      Knee/Hip Exercises: Supine   Bridges  20 reps      Knee/Hip Exercises: Sidelying   Hip ABduction  20 reps;Both    Clams  --      Manual Therapy   Manual Therapy  --    Soft tissue mobilization  --             PT Education - 05/10/17 [phone]    Education provided  Yes    Education Details  Adding stability to HEP, importance of stability training     Person(s) Educated  Patient    Methods  Explanation    Comprehension  Verbalized understanding       PT Short Term Goals - 05/02/17 1317      PT SHORT TERM GOAL #1   Baseline  Pt to be independent with initial HEP     Time  2    Period  Weeks    Target Date  05/15/17      PT SHORT TERM GOAL #2   Title  Pt to report decreased pain to 2/10 with activity     Time  2    Period  Weeks    Status  New    Target Date  05/15/17        PT Long Term Goals - 05/02/17 1320      PT LONG TERM GOAL #1   Title  Pt to report decreased pain to 0-2/10 with activity     Time  6    Period  Weeks    Status  New    Target Date  06/12/17      PT LONG TERM GOAL #2   Title  Pt to demo improved strength of Bil hips, to at least 4+/5 to improve stability and pain     Time  6    Period  Weeks    Status  New    Target Date  06/12/17      PT LONG TERM GOAL #3   Title  Pt to demo running mechanics to be [facility] to improve pain     Time  6    Period  Weeks    Status  New    Target Date  06/12/17            Plan - 05/10/17 0924    Clinical Impression Statement  Hip, glute, and core strength progressed today. Pt challenged with this, and requires cuing for form. Noted weakness in R hip vs L with ther ex today. Pt also with significant instability noted Bil, but R>L, with start of stability and balance exercises today. Pt unable to hold SLS for more than a few seconds, and will benefit from continued practice and progression of this, for improved ambulation and jogging mechanics. Tenderness at lateral hip and ITB decreased,  with palpation today.     Rehab Potential  Good    PT Frequency  2x / week    PT Duration  6 weeks    PT Treatment/Interventions  ADLs/Self Care Home Management;Cryotherapy;Electrical Stimulation;Iontophoresis 4mg /ml Dexamethasone;Functional mobility training;Stair training;Gait training;Ultrasound;Moist Heat;Therapeutic activities;Therapeutic exercise;Balance training;Neuromuscular re-education;Patient/family education;Passive range of motion;Orthotic Fit/Training;Manual techniques;Dry needling;Taping    PT Next Visit Plan  Run on T-Mill, short to long test for pelvis     Consulted and Agree with Plan of Care  Patient       Patient will benefit from skilled therapeutic intervention in order to improve the following deficits and impairments:  Abnormal gait, Decreased endurance, Hypomobility, Decreased strength, Pain, Difficulty walking, Decreased mobility, Decreased balance, Decreased range of motion, Improper body mechanics, Postural dysfunction, Impaired flexibility  Visit Diagnosis: Pain in right hip  Other abnormalities of gait and mobility     Problem List Patient Active Problem List   Diagnosis Date Noted  . 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 Acute medial meniscus tear of right knee 08/13/2015  . Arthritis of right knee 04/05/2015  . Effusion of right knee 03/16/2015   Lyndee Hensen, PT, DPT 9:27 AM  05/10/17    [facility] [place], [place], [address] Phone: [phone]   Fax:  [phone]  Name: Paul [name] MRN: [id number] Date of Birth: Apr 22, 1960

## 2017-05-16 ENCOUNTER — Ambulatory Visit (INDEPENDENT_AMBULATORY_CARE_PROVIDER_SITE_OTHER): Payer: Managed Care, Other (non HMO) | Admitting: Physical Therapy

## 2017-05-16 DIAGNOSIS — R2689 Other abnormalities of gait and mobility: Secondary | ICD-10-CM

## 2017-05-16 DIAGNOSIS — M25551 Pain in right hip: Secondary | ICD-10-CM

## 2017-05-16 NOTE — Therapy (Signed)
[facility] [address] [place], [place], [address] Phone: [phone]   Fax:  [phone]  Physical Therapy Treatment  Patient Details  Name: Paul [name] MRN: [id number] Date of Birth: Sep 18, 1960 Referring Provider: Charlann Boxer   Encounter Date: 05/16/2017  PT End of Session - 05/16/17 1526    Visit Number  4    Number of Visits  12    Date for PT Re-Evaluation  06/12/17    PT Start Time  1520    PT Stop Time  1606    PT Time Calculation (min)  46 min    Activity Tolerance  Patient tolerated treatment well    Behavior During Therapy  [facility] for tasks assessed/performed       Past Medical History:  Diagnosis Date  . Colon cancer ([place])    1992  . Hypertension   . Sebaceous cyst     Past Surgical History:  Procedure Laterality Date  . COLON SURGERY  1992  . KNEE SURGERY  2000  . tumor removed      There were no vitals filed for this visit.  Subjective Assessment - 05/16/17 1524    Subjective  Pt states improved pain in ITB region. He has been doing HEP. He has been able to run with only mild tightness feeling in hip.     Currently in Pain?  No/denies    Pain Score  0-No pain    Pain Location  Hip    Pain Type  Acute pain    Pain Onset  More than a month ago    Pain Frequency  Rarely                      [facility] Adult PT Treatment/Exercise - 05/16/17 1534      Knee/Hip Exercises: Stretches   Active Hamstring Stretch  3 reps;30 seconds;Both      Knee/Hip Exercises: Aerobic   Stationary Bike  L3 x 6 min       Knee/Hip Exercises: Standing   Hip Abduction  20 reps    Abduction Limitations  YTB    Hip Extension  20 reps    Extension Limitations  TYB    Functional Squat  Other (comment) x25    SLS  10 sec x5 bil    Other Standing Knee Exercises  Tandem stance with UE flex and UE rot 2x10 each bil;     Other Standing Knee Exercises  BOSU weight shifts L/R (flat side) x30      Knee/Hip Exercises: Supine    Bridges  20 reps    Bridges with Clamshell  20 reps      Knee/Hip Exercises: Sidelying   Hip ABduction  20 reps;Both      Manual Therapy   Manual Therapy  Soft tissue mobilization             PT Education - 05/16/17 1525    Education provided  Yes    Education Details  HEP, running progression , intervals     Person(s) Educated  Patient    Methods  Explanation    Comprehension  Verbalized understanding       PT Short Term Goals - 05/02/17 1317      PT SHORT TERM GOAL #1   Baseline  Pt to be independent with initial HEP     Time  2    Period  Weeks    Target Date  05/15/17  PT SHORT TERM GOAL #2   Title  Pt to report decreased pain to 2/10 with activity     Time  2    Period  Weeks    Status  New    Target Date  05/15/17        PT Long Term Goals - 05/02/17 1320      PT LONG TERM GOAL #1   Title  Pt to report decreased pain to 0-2/10 with activity     Time  6    Period  Weeks    Status  New    Target Date  06/12/17      PT LONG TERM GOAL #2   Title  Pt to demo improved strength of Bil hips, to at least 4+/5 to improve stability and pain     Time  6    Period  Weeks    Status  New    Target Date  06/12/17      PT LONG TERM GOAL #3   Title  Pt to demo running mechanics to be [facility] to improve pain     Time  6    Period  Weeks    Status  New    Target Date  06/12/17            Plan - 05/16/17 1612    Clinical Impression Statement  Pt showing good improvements with pain and tenderness in lateral hip and ITB. He has been very diligent with stretching with HEP. Pt has been challenged with hip strengthening, and will continue to benefit from practice and education on this. He also is very challenged with stability exercises, and shows poor ability for dynamic stability with SLS, and tandem stance. Recommend continued care, plan to work towards d/c in next couple weeks. ,     Rehab Potential  Good    PT Frequency  2x / week    PT Duration  6  weeks    PT Treatment/Interventions  ADLs/Self Care Home Management;Cryotherapy;Electrical Stimulation;Iontophoresis 4mg /ml Dexamethasone;Functional mobility training;Stair training;Gait training;Ultrasound;Moist Heat;Therapeutic activities;Therapeutic exercise;Balance training;Neuromuscular re-education;Patient/family education;Passive range of motion;Orthotic Fit/Training;Manual techniques;Dry needling;Taping    PT Next Visit Plan  Run on T-Mill, short to long test for pelvis     Consulted and Agree with Plan of Care  Patient       Patient will benefit from skilled therapeutic intervention in order to improve the following deficits and impairments:  Abnormal gait, Decreased endurance, Hypomobility, Decreased strength, Pain, Difficulty walking, Decreased mobility, Decreased balance, Decreased range of motion, Improper body mechanics, Postural dysfunction, Impaired flexibility  Visit Diagnosis: Pain in right hip  Other abnormalities of gait and mobility     Problem List Patient Active Problem List   Diagnosis Date Noted  . 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 Acute medial meniscus tear of right knee 08/13/2015  . Arthritis of right knee 04/05/2015  . Effusion of right knee 03/16/2015   Lyndee Hensen, PT, DPT 4:15 PM  05/16/17    [facility] [place], [place], [address] Phone: [phone]   Fax:  [phone]  Name: Paul [name] MRN: [id number] Date of Birth: [birth year]

## 2017-05-16 NOTE — Progress Notes (Signed)
[name] [place], [place] [address] Phone: [phone] Subjective:     CC: Right hip pain follow-up  [id number]  Joshia Kitchings is a 57 y.o. male coming in with complaint of right hip pain follow-up.  Found to have snapping hip.  Has been going to formal physical therapy.  Doing much better.  States that the pain is 95% better.  Has increased running.  Feels that physical therapy was significantly helpful.     Past Medical History:  Diagnosis Date  . Colon cancer ([place])    1992  . Hypertension   . Sebaceous cyst    Past Surgical History:  Procedure Laterality Date  . COLON SURGERY  1992  . KNEE SURGERY  2000  . tumor removed     Social History   Socioeconomic History  . Marital status: Married    Spouse name: Not on file  . Number of children: Not on file  . Years of education: Not on file  . Highest education level: Not on file  Social Needs  . Financial resource strain: Not on file  . Food insecurity - worry: Not on file  . Food insecurity - inability: Not on file  . Transportation needs - medical: Not on file  . Transportation needs - non-medical: Not on file  Occupational History  . Not on file  Tobacco Use  . Smoking status: Never Smoker  . Smokeless tobacco: Never Used  Substance and Sexual Activity  . Alcohol use: Yes    Alcohol/week: 1.8 oz    Types: 3 Cans of beer per week    Comment: per week  . Drug use: No  . Sexual activity: No  Other Topics Concern  . Not on file  Social History Narrative  . Not on file   No Known Allergies No family history on file.   Past medical history, social, surgical and family history all reviewed in electronic medical record.  No pertanent information unless stated regarding to the chief complaint.   Review of Systems:Review of systems updated and as accurate as of 05/16/17  No headache, visual changes, nausea, vomiting, diarrhea, constipation, dizziness, abdominal  pain, skin rash, fevers, chills, night sweats, weight loss, swollen lymph nodes, body aches, joint swelling, muscle aches, chest pain, shortness of breath, mood changes.   Objective  There were no vitals taken for this visit. Systems examined below as of 05/16/17   General: No apparent distress alert and oriented x3 mood and affect normal, dressed appropriately.  HEENT: Pupils equal, extraocular movements intact  Respiratory: Patient's speak in full sentences and does not appear short of breath  Cardiovascular: No lower extremity edema, non tender, no erythema  Skin: Warm dry intact with no signs of infection or rash on extremities or on axial skeleton.  Abdomen: Soft nontender  Neuro: Cranial nerves II through XII are intact, neurovascularly intact in all extremities with 2+ DTRs and 2+ pulses.  Lymph: No lymphadenopathy of posterior or anterior cervical chain or axillae bilaterally.  Gait normal with good balance and coordination.  MSK:  Non tender with full range of motion and good stability and symmetric strength and tone of shoulders, elbows, wrist,  knee and ankles bilaterally.  Hip: Right ROM IR: 35 Deg, ER: 35 Deg, Flexion: 120 Deg, Extension: 100 Deg, Abduction: 45 Deg, Adduction: 25 Deg Strength IR: 5/5, ER: 5/5, Flexion: 5/5, Extension: 5/5, Abduction: 5/5, Adduction: 5/5 Pelvic alignment unremarkable to inspection and palpation. Standing hip  rotation and gait without trendelenburg sign / unsteadiness. Greater trochanter without tenderness to palpation. No tenderness over piriformis and greater trochanter. Positive Faber on the right No SI joint tenderness and normal minimal SI movement.   Impression and Recommendations:     This case required medical decision making of moderate complexity.      Note: This dictation was prepared with Dragon dictation along with smaller phrase technology. Any transcriptional errors that result from this process are unintentional.

## 2017-05-17 ENCOUNTER — Other Ambulatory Visit: Payer: Self-pay

## 2017-05-17 ENCOUNTER — Encounter: Payer: Self-pay | Admitting: Family Medicine

## 2017-05-17 ENCOUNTER — Ambulatory Visit: Payer: Managed Care, Other (non HMO) | Admitting: Family Medicine

## 2017-05-17 DIAGNOSIS — M24851 Other specific joint derangements of right hip, not elsewhere classified: Secondary | ICD-10-CM | POA: Diagnosis not present

## 2017-05-17 MED ORDER — DICLOFENAC SODIUM 2 % TD SOLN: 2.0000 g | Freq: Two times a day (BID) | TRANSDERMAL | 3 refills | Status: DC

## 2017-05-17 NOTE — Assessment & Plan Note (Signed)
Patient does have more of a snapping hip.  Discussed icing regimen and home exercises.  Discussed which activities to do which wants to avoid.  Patient will follow up with me again in 4-6 weeks.  Doing very well overall.

## 2017-05-17 NOTE — Patient Instructions (Signed)
Good to see you  Paul [name] is your friend.  Wart removal cream daily for a week and then see if you can work it out.  Keep doing the exercises  I think you are good to go  See me again right after the race!

## 2017-05-22 ENCOUNTER — Encounter: Payer: Self-pay | Admitting: Physical Therapy

## 2017-05-22 ENCOUNTER — Ambulatory Visit (INDEPENDENT_AMBULATORY_CARE_PROVIDER_SITE_OTHER): Payer: Managed Care, Other (non HMO) | Admitting: Physical Therapy

## 2017-05-22 DIAGNOSIS — M25551 Pain in right hip: Secondary | ICD-10-CM

## 2017-05-22 DIAGNOSIS — R2689 Other abnormalities of gait and mobility: Secondary | ICD-10-CM

## 2017-05-22 NOTE — Therapy (Signed)
[facility] [address] [place], [place], [address] Phone: [phone]   Fax:  [phone]  Physical Therapy Treatment  Patient Details  Name: Jaxston Chohan MRN: [id number] Date of Birth: 07/17/1960 Referring Provider: Charlann Boxer   Encounter Date: 05/22/2017  PT End of Session - 05/22/17 1649    Visit Number  5    Number of Visits  12    Date for PT Re-Evaluation  06/12/17    PT Start Time  4193    PT Stop Time  1653    PT Time Calculation (min)  43 min    Activity Tolerance  Patient tolerated treatment well    Behavior During Therapy  [facility] for tasks assessed/performed       Past Medical History:  Diagnosis Date  . Colon cancer ([place])    1992  . Hypertension   . Sebaceous cyst     Past Surgical History:  Procedure Laterality Date  . COLON SURGERY  1992  . KNEE SURGERY  2000  . tumor removed      There were no vitals filed for this visit.  Subjective Assessment - 05/22/17 1647    Subjective  Pt had follow up with MD, with good report. He has been slowly increasing running milage, doing intervals.     Limitations  Walking    Currently in Pain?  No/denies    Pain Score  0-No pain                      [facility] Adult PT Treatment/Exercise - 05/22/17 1613      Knee/Hip Exercises: Stretches   Active Hamstring Stretch  --    ITB Stretch  3 reps;30 seconds    ITB Stretch Limitations  standing    Piriformis Stretch  3 reps;30 seconds    Piriformis Stretch Limitations  seated      Knee/Hip Exercises: Aerobic   Tread Mill  2.8 mph walk; x8 min       Knee/Hip Exercises: Standing   Hip Abduction  20 reps    Abduction Limitations  YTB    Hip Extension  20 reps    Extension Limitations  TYB    Functional Squat  Other (comment) x25    SLS  30 sec x3 bil    Other Standing Knee Exercises  Tandem stance  1 min x2 each bil;     Other Standing Knee Exercises  BOSU weight shifts L/R (flat side) x30      Knee/Hip  Exercises: Supine   Bridges  20 reps      Knee/Hip Exercises: Sidelying   Hip ABduction  20 reps;Both    Clams  x20 bil      Manual Therapy   Manual Therapy  --             PT Education - 05/22/17 1649    Education provided  Yes    Education Details  HEP    Person(s) Educated  Patient    Methods  Explanation    Comprehension  Verbalized understanding       PT Short Term Goals - 05/02/17 1317      PT SHORT TERM GOAL #1   Baseline  Pt to be independent with initial HEP     Time  2    Period  Weeks    Target Date  05/15/17      PT SHORT TERM GOAL #2   Title  Pt to  report decreased pain to 2/10 with activity     Time  2    Period  Weeks    Status  New    Target Date  05/15/17        PT Long Term Goals - 05/02/17 1320      PT LONG TERM GOAL #1   Title  Pt to report decreased pain to 0-2/10 with activity     Time  6    Period  Weeks    Status  New    Target Date  06/12/17      PT LONG TERM GOAL #2   Title  Pt to demo improved strength of Bil hips, to at least 4+/5 to improve stability and pain     Time  6    Period  Weeks    Status  New    Target Date  06/12/17      PT LONG TERM GOAL #3   Title  Pt to demo running mechanics to be [facility] to improve pain     Time  6    Period  Weeks    Status  New    Target Date  06/12/17            Plan - 05/22/17 1650    Clinical Impression Statement  Pt able to perform and progress ther ex without increased pain today. He has muscle fatigue with hip stabilization progressions, but has been able to increase reps and resistance. Pt with noted R trunk lean with ambulation, will benefit from continued strength and stabilization to improve this.     Rehab Potential  Good    PT Frequency  2x / week    PT Duration  6 weeks    PT Treatment/Interventions  ADLs/Self Care Home Management;Cryotherapy;Electrical Stimulation;Iontophoresis 4mg /ml Dexamethasone;Functional mobility training;Stair training;Gait  training;Ultrasound;Moist Heat;Therapeutic activities;Therapeutic exercise;Balance training;Neuromuscular re-education;Patient/family education;Passive range of motion;Orthotic Fit/Training;Manual techniques;Dry needling;Taping    PT Next Visit Plan  Run on T-Mill, short to long test for pelvis     Consulted and Agree with Plan of Care  Patient       Patient will benefit from skilled therapeutic intervention in order to improve the following deficits and impairments:  Abnormal gait, Decreased endurance, Hypomobility, Decreased strength, Pain, Difficulty walking, Decreased mobility, Decreased balance, Decreased range of motion, Improper body mechanics, Postural dysfunction, Impaired flexibility  Visit Diagnosis: Pain in right hip  Other abnormalities of gait and mobility     Problem List Patient Active Problem List   Diagnosis Date Noted  . 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 Acute medial meniscus tear of right knee 08/13/2015  . Arthritis of right knee 04/05/2015  . Effusion of right knee 03/16/2015   Lyndee Hensen, PT, DPT 4:59 PM  05/22/17    [facility] [place], [place], [address] Phone: [phone]   Fax:  [phone]  Name: Raequan Vanschaick MRN: [id number] Date of Birth: [birth year]

## 2017-05-27 NOTE — Progress Notes (Signed)
[name] [place], [place] [address] Phone: [phone] Subjective:     CC: Foot pain  TIR:[id number]  Paul [name] is a 57 y.o. male coming in with complaint of right foot pain.  Feels like he stepped on something.  Patient states that since then is unable to put significant pressure on the bottom of his foot.  Going on for 2-3 weeks at this time.  Seems to be worsening.  Unable to run secondary to discomfort patient denies any fevers or chills or any abnormal weight loss.      Past Medical History:  Diagnosis Date  . Colon cancer ([place])    1992  . Hypertension   . Sebaceous cyst    Past Surgical History:  Procedure Laterality Date  . COLON SURGERY  1992  . KNEE SURGERY  2000  . tumor removed     Social History   Socioeconomic History  . Marital status: Married    Spouse name: Not on file  . Number of children: Not on file  . Years of education: Not on file  . Highest education level: Not on file  Social Needs  . Financial resource strain: Not on file  . Food insecurity - worry: Not on file  . Food insecurity - inability: Not on file  . Transportation needs - medical: Not on file  . Transportation needs - non-medical: Not on file  Occupational History  . Not on file  Tobacco Use  . Smoking status: Never Smoker  . Smokeless tobacco: Never Used  Substance and Sexual Activity  . Alcohol use: Yes    Alcohol/week: 1.8 oz    Types: 3 Cans of beer per week    Comment: per week  . Drug use: No  . Sexual activity: No  Other Topics Concern  . Not on file  Social History Narrative  . Not on file   No Known Allergies No family history on file.   Past medical history, social, surgical and family history all reviewed in electronic medical record.  No pertanent information unless stated regarding to the chief complaint.   Review of Systems:Review of systems updated and as accurate as of 05/27/17  No headache, visual  changes, nausea, vomiting, diarrhea, constipation, dizziness, abdominal pain, skin rash, fevers, chills, night sweats, weight loss, swollen lymph nodes, body aches, joint swelling, muscle aches, chest pain, shortness of breath, mood changes.   Objective  There were no vitals taken for this visit. Systems examined below as of 05/27/17   General: No apparent distress alert and oriented x3 mood and affect normal, dressed appropriately.  HEENT: Pupils equal, extraocular movements intact  Respiratory: Patient's speak in full sentences and does not appear short of breath  Cardiovascular: No lower extremity edema, non tender, no erythema  Skin: Warm dry intact with no signs of infection or rash on extremities or on axial skeleton.  Abdomen: Soft nontender  Neuro: Cranial nerves II through XII are intact, neurovascularly intact in all extremities with 2+ DTRs and 2+ pulses.  Lymph: No lymphadenopathy of posterior or anterior cervical chain or axillae bilaterally.  Gait normal with good balance and coordination.  MSK:  Non tender with full range of motion and good stability and symmetric strength and tone of shoulders, elbows, wrist, hip, knee and ankles bilaterally.   Right foot exam on the plantar aspect laterally does show a punctate area.  Callus formation surrounding the area.  Tender to palpation in the  area.  No sign of infection.  After verbal consent patient was prepped with alcohol swabs and with a 10 blade patient did have cut down the callus surrounding the area.  Then with tweezers patient had picked out numerous pieces of small likely collapse noted.  No signs of infection.  Continue to widen the area to make sure there is no other loose debris.  Liquid nitrogen use.  Band-Aid placed.  Minimal blood loss.  Postinjection instructions given.    Impression and Recommendations:     This case required medical decision making of moderate complexity.      Note: This dictation was prepared  with Dragon dictation along with smaller phrase technology. Any transcriptional errors that result from this process are unintentional.

## 2017-05-28 ENCOUNTER — Ambulatory Visit: Payer: Managed Care, Other (non HMO) | Admitting: Family Medicine

## 2017-05-28 ENCOUNTER — Encounter: Payer: Self-pay | Admitting: Family Medicine

## 2017-05-28 DIAGNOSIS — M60271 Foreign body granuloma of soft tissue, not elsewhere classified, right ankle and foot: Secondary | ICD-10-CM | POA: Insufficient documentation

## 2017-05-28 NOTE — Assessment & Plan Note (Signed)
Debridement done today.  Tolerated the procedure well.  We discussed icing regimen and home exercises.  Any worsening symptoms patient may need to have a truly opened up and washed out.  Patient is in agreement with the plan.  We will do home care.  Follow-up again in 1-2 weeks

## 2017-05-28 NOTE — Patient Instructions (Signed)
Good to see you  Paul [name] is your friend.  See me again in 7-10 days in case we need to do more.

## 2017-05-29 ENCOUNTER — Encounter: Payer: Self-pay | Admitting: Physical Therapy

## 2017-05-29 ENCOUNTER — Ambulatory Visit (INDEPENDENT_AMBULATORY_CARE_PROVIDER_SITE_OTHER): Payer: Managed Care, Other (non HMO) | Admitting: Physical Therapy

## 2017-05-29 DIAGNOSIS — R2689 Other abnormalities of gait and mobility: Secondary | ICD-10-CM

## 2017-05-29 DIAGNOSIS — M25551 Pain in right hip: Secondary | ICD-10-CM

## 2017-05-29 NOTE — Therapy (Signed)
[facility] [address] [place], [place], [address] Phone: [phone]   Fax:  [phone]  Physical Therapy Treatment/Discharge  Patient Details  Name: Paul [name] MRN: [id number] Date of Birth: 1960-07-26 Referring Provider: Charlann Boxer   Encounter Date: 05/29/2017  PT End of Session - 05/29/17 1650    Visit Number  6    Number of Visits  12    Date for PT Re-Evaluation  06/12/17    PT Start Time  1525    PT Stop Time  1605    PT Time Calculation (min)  40 min    Activity Tolerance  Patient tolerated treatment well    Behavior During Therapy  [facility] for tasks assessed/performed       Past Medical History:  Diagnosis Date  . Colon cancer ([place])    1992  . Hypertension   . Sebaceous cyst     Past Surgical History:  Procedure Laterality Date  . COLON SURGERY  1992  . KNEE SURGERY  2000  . tumor removed      There were no vitals filed for this visit.  Subjective Assessment - 05/29/17 1527    Subjective  Pt states minimal pain in hip. He had piece of glass removed from R foot last week, which has been slightly sore but has been able to run and walk on it.     Currently in Pain?  No/denies    Pain Score  0-No pain         [facility] PT Assessment - 05/29/17 0001      AROM   Overall AROM Comments  WNL      Strength   Overall Strength Comments  Hips: 4+/5                  [facility] Adult PT Treatment/Exercise - 05/29/17 1532      Knee/Hip Exercises: Stretches   ITB Stretch  3 reps;30 seconds    ITB Stretch Limitations  standing    Piriformis Stretch  3 reps;30 seconds    Piriformis Stretch Limitations  seated      Knee/Hip Exercises: Aerobic   Stationary Bike  L3 x 6 m in      Knee/Hip Exercises: Standing   Hip Abduction  20 reps    Abduction Limitations  YTB    Hip Extension  20 reps    Extension Limitations  TYB    Functional Squat  Other (comment);Limitations x25    Functional Squat Limitations  on BOSU     SLS  30 sec x3 bil    Other Standing Knee Exercises  Tandem stance  1 min x2 each bil with UE ball raise    Other Standing Knee Exercises  BOSU weight shifts L/R (flat side) x30      Knee/Hip Exercises: Supine   Bridges  20 reps    Single Leg Bridge  10 reps;Both    Other Supine Knee/Hip Exercises  Clams with GTB and TA x20       Knee/Hip Exercises: Sidelying   Hip ABduction  --    Clams  --             PT Education - 05/29/17 1527    Education provided  Yes    Education Details  HEP,     Person(s) Educated  Patient    Methods  Explanation    Comprehension  Verbalized understanding       PT Short Term Goals - 05/29/17  [place] #1   Baseline  Pt to be independent with initial HEP     Time  2    Period  Weeks    Status  Achieved      PT SHORT TERM GOAL #2   Title  Pt to report decreased pain to 2/10 with activity     Time  2    Period  Weeks    Status  Achieved        PT Long Term Goals - 05/29/17 1651      PT LONG TERM GOAL #1   Title  Pt to report decreased pain to 0-2/10 with activity     Time  6    Period  Weeks    Status  Achieved      PT LONG TERM GOAL #2   Title  Pt to demo improved strength of Bil hips, to at least 4+/5 to improve stability and pain     Time  6    Period  Weeks    Status  Achieved      PT LONG TERM GOAL #3   Title  Pt to demo running mechanics to be [facility] to improve pain     Time  6    Period  Weeks    Status  Achieved            Plan - 05/29/17 1651    Clinical Impression Statement  Pt has made good improvements, and is ready for d/c to HEP. He has been pain free, and has good understanding of final HEP. Pt has much improved strength, and improving stability. He continues to have single leg stability deficits, with dynamic movement, and will benefit from continued work on this with HEP. Final HEP reviwed today.     PT Treatment/Interventions  ADLs/Self Care Home Management;Cryotherapy;Electrical  Stimulation;Iontophoresis 88m/ml Dexamethasone;Functional mobility training;Stair training;Gait training;Ultrasound;Moist Heat;Therapeutic activities;Therapeutic exercise;Balance training;Neuromuscular re-education;Patient/family education;Passive range of motion;Orthotic Fit/Training;Manual techniques;Dry needling;Taping       Patient will benefit from skilled therapeutic intervention in order to improve the following deficits and impairments:  Abnormal gait, Decreased endurance, Hypomobility, Decreased strength, Pain, Difficulty walking, Decreased mobility, Decreased balance, Decreased range of motion, Improper body mechanics, Postural dysfunction, Impaired flexibility  Visit Diagnosis: Pain in right hip  Other abnormalities of gait and mobility     Problem List Patient Active Problem List   Diagnosis Date Noted  . Foreign body granuloma of soft tissue of right foot 05/28/2017  . 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 Acute medial meniscus tear of right knee 08/13/2015  . Arthritis of right knee 04/05/2015  . Effusion of right knee 03/16/2015  LLyndee Hensen PT, DPT 4:56 PM  05/29/17    [facility] [id number] [address]Phone: [phone]  Fax:  [phone] Name: Paul PolanMRN: [id number] of Birth: [birth year]  PHYSICAL THERAPY DISCHARGE SUMMARY  Visits from Start of Care:6  Plan: Patient agrees to discharge.  Patient goals were met. Patient is being discharged due to meeting the stated rehab goals.  ?????       LLyndee Hensen PT, DPT 4:57 PM  05/29/17

## 2017-06-07 NOTE — Progress Notes (Signed)
[name] [address] [place], [place] [address] Phone: [phone] Subjective:     CC: Foot pain follow-up  IZT:[id number]  Paul [name] is a 57 y.o. male coming in with complaint of foot pain.  Found to have a foreign body granuloma of the soft tissue of the right foot.  Seem to be likely glass.  Patient states that his foot is doing better but that his right hip was bothering him. He had to drop out of his race that is coming up next weekend. He increased his mileage and ran consecutive days and the hip pain increased.  Patient states that the foot feels fine but when he is running he has significant pain in the lateral aspect of the hip.  Was continued on the physical therapy but was not making as much improvement.    Past Medical History:  Diagnosis Date  . Colon cancer ([place])    1992  . Hypertension   . Sebaceous cyst    Past Surgical History:  Procedure Laterality Date  . COLON SURGERY  1992  . KNEE SURGERY  2000  . tumor removed     Social History   Socioeconomic History  . Marital status: Married    Spouse name: None  . Number of children: None  . Years of education: None  . Highest education level: None  Social Needs  . Financial resource strain: None  . Food insecurity - worry: None  . Food insecurity - inability: None  . Transportation needs - medical: None  . Transportation needs - non-medical: None  Occupational History  . None  Tobacco Use  . Smoking status: Never Smoker  . Smokeless tobacco: Never Used  Substance and Sexual Activity  . Alcohol use: Yes    Alcohol/week: 1.8 oz    Types: 3 Cans of beer per week    Comment: per week  . Drug use: No  . Sexual activity: No  Other Topics Concern  . None  Social History Narrative  . None   No Known Allergies No family history on file.   Past medical history, social, surgical and family history all reviewed in electronic medical record.  No pertanent information unless  stated regarding to the chief complaint.   Review of Systems:Review of systems updated and as accurate  No headache, visual changes, nausea, vomiting, diarrhea, constipation, dizziness, abdominal pain, skin rash, fevers, chills, night sweats, weight loss, swollen lymph nodes, body aches, joint swelling, muscle aches, chest pain, shortness of breath, mood changes.   Objective  Blood pressure 122/84, pulse 65, height 5\' 6"  (1.676 m), weight 173 lb (78.5 kg), SpO2 97 %.   General: No apparent distress alert and oriented x3 mood and affect normal, dressed appropriately.  HEENT: Pupils equal, extraocular movements intact  Respiratory: Patient's speak in full sentences and does not appear short of breath  Cardiovascular: No lower extremity edema, non tender, no erythema  Skin: Warm dry intact with no signs of infection or rash on extremities or on axial skeleton.  Abdomen: Soft nontender  Neuro: Cranial nerves II through XII are intact, neurovascularly intact in all extremities with 2+ DTRs and 2+ pulses.  Lymph: No lymphadenopathy of posterior or anterior cervical chain or axillae bilaterally.  Gait normal with good balance and coordination.  MSK:  Non tender with full range of motion and good stability and symmetric strength and tone of shoulders, elbows, wrist,  knee and ankles bilaterally. Hip exam shows the patient  is having increasing discomfort and pain on the lateral aspect of the hip.  Positive Corky Sox.  Negative straight leg test.  Neurovascularly intact distally.  Full strength.  Procedure: Real-time Ultrasound Guided Injection of right greater trochanteric bursitis secondary to patient's body habitus Device: GE Logiq Q7 Ultrasound guided injection is preferred based studies that show increased duration, increased effect, greater accuracy, decreased procedural pain, increased response rate, and decreased cost with ultrasound guided versus blind injection.  Verbal informed consent obtained.    Time-out conducted.  Noted no overlying erythema, induration, or other signs of local infection.  Skin prepped in a sterile fashion.  Local anesthesia: Topical Ethyl chloride.  With sterile technique and under real time ultrasound guidance:  Greater trochanteric area was visualized and patient's bursa was noted. A 22-gauge 3 inch needle was inserted and 4 cc of 0.5% Marcaine and 1 cc of Kenalog 40 mg/dL was injected. Pictures taken Completed without difficulty  Pain immediately resolved suggesting accurate placement of the medication.  Advised to call if fevers/chills, erythema, induration, drainage, or persistent bleeding.  Images permanently stored and available for review in the ultrasound unit.  Impression: Technically successful ultrasound guided injection.   Impression and Recommendations:     This case required medical decision making of moderate complexity.      Note: This dictation was prepared with Dragon dictation along with smaller phrase technology. Any transcriptional errors that result from this process are unintentional.

## 2017-06-08 ENCOUNTER — Ambulatory Visit: Payer: Self-pay

## 2017-06-08 ENCOUNTER — Ambulatory Visit: Payer: Managed Care, Other (non HMO) | Admitting: Family Medicine

## 2017-06-08 ENCOUNTER — Encounter: Payer: Self-pay | Admitting: Family Medicine

## 2017-06-08 VITALS — BP 122/84 | HR 65 | Ht 66.0 in | Wt 173.0 lb

## 2017-06-08 DIAGNOSIS — M79671 Pain in right foot: Secondary | ICD-10-CM | POA: Diagnosis not present

## 2017-06-08 DIAGNOSIS — M7061 Trochanteric bursitis, right hip: Secondary | ICD-10-CM

## 2017-06-08 NOTE — Patient Instructions (Addendum)
Good to see you  Paul [name] is your friend. Ice 20 minutes 2 times daily. Usually after activity and before bed. Injected the side of the hip  Xray downstairs Start running again after the weekend.  See me again in 3 weeks

## 2017-06-10 DIAGNOSIS — M7061 Trochanteric bursitis, right hip: Secondary | ICD-10-CM | POA: Insufficient documentation

## 2017-06-10 NOTE — Assessment & Plan Note (Signed)
Patient given injection and tolerated the procedure well.  We discussed icing regimen and home exercises.  Patient will continue with conservative therapy.  Worsening symptoms come back sooner.  Advanced imaging may be warranted.  Follow-up again in 4 weeks

## 2017-06-19 ENCOUNTER — Ambulatory Visit: Payer: Managed Care, Other (non HMO) | Admitting: Family Medicine

## 2017-06-19 ENCOUNTER — Ambulatory Visit (INDEPENDENT_AMBULATORY_CARE_PROVIDER_SITE_OTHER)
Admission: RE | Admit: 2017-06-19 | Discharge: 2017-06-19 | Disposition: A | Discharge status: Home / Self Care | Payer: Managed Care, Other (non HMO) | Source: Ambulatory Visit | Attending: Family Medicine | Admitting: Family Medicine

## 2017-06-19 ENCOUNTER — Encounter: Payer: Self-pay | Admitting: Family Medicine

## 2017-06-19 VITALS — BP 132/86 | HR 73 | Ht 66.0 in | Wt 174.0 lb

## 2017-06-19 DIAGNOSIS — M24851 Other specific joint derangements of right hip, not elsewhere classified: Secondary | ICD-10-CM

## 2017-06-19 DIAGNOSIS — M25551 Pain in right hip: Secondary | ICD-10-CM | POA: Diagnosis not present

## 2017-06-19 DIAGNOSIS — M79671 Pain in right foot: Secondary | ICD-10-CM | POA: Diagnosis not present

## 2017-06-19 MED ORDER — GABAPENTIN 100 MG PO CAPS
200.0000 mg | ORAL_CAPSULE | Freq: Every day | ORAL | 3 refills | Status: DC
Start: 2017-06-19 — End: 2017-10-20

## 2017-06-19 NOTE — Progress Notes (Signed)
[name] [place], [place] [address] Phone: [phone] Subjective:     CC: Right hip pain  [id number]  Paul [name] is a 57 y.o. male coming in with complaint of right hip pain. He said that the is still having pain on the iliac crest. He has modified his running to a run walk and is not running back to back days. Patient feels a constant pull on that side not a pain. He has been stretching.  States that this is a little bit different than what it was previously or a little more localized.  Feels that the lateral aspect of the pain is much more improved.       Past Medical History:  Diagnosis Date  . Colon cancer ([place])    1992  . Hypertension   . Sebaceous cyst    Past Surgical History:  Procedure Laterality Date  . COLON SURGERY  1992  . KNEE SURGERY  2000  . tumor removed     Social History   Socioeconomic History  . Marital status: Married    Spouse name: Not on file  . Number of children: Not on file  . Years of education: Not on file  . Highest education level: Not on file  Occupational History  . Not on file  Social Needs  . Financial resource strain: Not on file  . Food insecurity:    Worry: Not on file    Inability: Not on file  . Transportation needs:    Medical: Not on file    Non-medical: Not on file  Tobacco Use  . Smoking status: Never Smoker  . Smokeless tobacco: Never Used  Substance and Sexual Activity  . Alcohol use: Yes    Alcohol/week: 1.8 oz    Types: 3 Cans of beer per week    Comment: per week  . Drug use: No  . Sexual activity: Never  Lifestyle  . Physical activity:    Days per week: Not on file    Minutes per session: Not on file  . Stress: Not on file  Relationships  . Social connections:    Talks on phone: Not on file    Gets together: Not on file    Attends religious service: Not on file    Active member of club or organization: Not on file    Attends meetings of clubs or  organizations: Not on file    Relationship status: Not on file  Other Topics Concern  . Not on file  Social History Narrative  . Not on file   No Known Allergies No family history on file.   Past medical history, social, surgical and family history all reviewed in electronic medical record.  No pertanent information unless stated regarding to the chief complaint.   Review of Systems:Review of systems updated and as accurate as of 06/19/17  No headache, visual changes, nausea, vomiting, diarrhea, constipation, dizziness, abdominal pain, skin rash, fevers, chills, night sweats, weight loss, swollen lymph nodes, body aches, joint swelling, muscle aches, chest pain, shortness of breath, mood changes.   Objective  Blood pressure 132/86, pulse 73, height 5\' 6"  (1.676 m), weight 174 lb (78.9 kg), SpO2 98 %. Systems examined below as of 06/19/17   General: No apparent distress alert and oriented x3 mood and affect normal, dressed appropriately.  HEENT: Pupils equal, extraocular movements intact  Respiratory: Patient's speak in full sentences and does not appear short of breath  Cardiovascular:  No lower extremity edema, non tender, no erythema  Skin: Warm dry intact with no signs of infection or rash on extremities or on axial skeleton.  Abdomen: Soft nontender  Neuro: Cranial nerves II through XII are intact, neurovascularly intact in all extremities with 2+ DTRs and 2+ pulses.  Lymph: No lymphadenopathy of posterior or anterior cervical chain or axillae bilaterally.  Gait normal with good balance and coordination.  MSK:  Non tender with full range of motion and good stability and symmetric strength and tone of shoulders, elbows, wrist, hip, knee and ankles bilaterally.  Right hip exam shows more tenderness over the iliac crest.  Mild tightness with Corky Sox but no pain with internal rotation.  No pain with flexion.  No masses palpated.  No pain in the abdominal region.  Negative straight leg  test.  No back pain noted.    Impression and Recommendations:     This case required medical decision making of moderate complexity.      Note: This dictation was prepared with Dragon dictation along with smaller phrase technology. Any transcriptional errors that result from this process are unintentional.

## 2017-06-19 NOTE — Assessment & Plan Note (Signed)
I believe still likely snapping hip syndrome.  Differential is quite broad.  X-rays ordered for further evaluation and back as well as hip.  He need MRI secondary to history of colon cancer.  I do not believe that this is likely coming from the arthritis of the knee at this time.  Discussed icing regimen topical anti-inflammatories started gabapentin for nighttime relief.  Follow-up again in 3 weeks

## 2017-06-19 NOTE — Patient Instructions (Signed)
Good to see you  I am sorry you are still hurting.  Xrays downstairs Gabapentin 200mg  at night More elliptical, biking and swimming Continue the exercises See me again in 3 weeks   Grip 6 belts

## 2017-07-11 ENCOUNTER — Ambulatory Visit: Payer: Managed Care, Other (non HMO) | Admitting: Family Medicine

## 2017-07-11 ENCOUNTER — Encounter: Payer: Self-pay | Admitting: Family Medicine

## 2017-07-11 DIAGNOSIS — M7061 Trochanteric bursitis, right hip: Secondary | ICD-10-CM | POA: Diagnosis not present

## 2017-07-11 NOTE — Assessment & Plan Note (Signed)
Improved overall.  We will continue to monitor.  I do think that there is some piriformis syndrome as well as somewhat of the lumbar spine.  We will continue to monitor closely.  Patient will follow-up in 1 month worsening symptoms otherwise see me as needed but will continue to check in with my chart.  Use gabapentin as needed

## 2017-07-11 NOTE — Patient Instructions (Signed)
Good to see you  Paul [name] is your friend.  Tennis ball with a lot of sitting  Keep increasing slowly.  Gabapentin 0-3 pills at night as needed Write me in 1 month and tell me how you are doing.  See me again when you need me

## 2017-07-11 NOTE — Progress Notes (Signed)
[name] [place], [place] [address] Phone: [phone] Subjective:      CC: Hip pain   [id number]  Paul [name] is a 57 y.o. male coming in with complaint of hip pain. He has been stretching and the medication that he takes has been helping. He runs every other day. The days he does run his hip feels ok. He does experience stiffness in the afternoons which is alleviated by stretching. He still does a run walk when he is working out.  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  Patient though has been doing well with progressing with the conservative therapy at this point.  Patient feels that the gabapentin has been helpful.     Past Medical History:  Diagnosis Date  . Colon cancer ([place])    1992  . Hypertension   . Sebaceous cyst    Past Surgical History:  Procedure Laterality Date  . COLON SURGERY  1992  . KNEE SURGERY  2000  . tumor removed     Social History   Socioeconomic History  . Marital status: Married    Spouse name: Not on file  . Number of children: Not on file  . Years of education: Not on file  . Highest education level: Not on file  Occupational History  . Not on file  Social Needs  . Financial resource strain: Not on file  . Food insecurity:    Worry: Not on file    Inability: Not on file  . Transportation needs:    Medical: Not on file    Non-medical: Not on file  Tobacco Use  . Smoking status: Never Smoker  . Smokeless tobacco: Never Used  Substance and Sexual Activity  . Alcohol use: Yes    Alcohol/week: 1.8 oz    Types: 3 Cans of beer per week    Comment: per week  . Drug use: No  . Sexual activity: Never  Lifestyle  . Physical activity:    Days per week: Not on file    Minutes per session: Not on file  . Stress: Not on file  Relationships  . Social connections:    Talks on phone: Not on file    Gets together: Not on file    Attends religious service: Not  on file    Active member of club or organization: Not on file    Attends meetings of clubs or organizations: Not on file    Relationship status: Not on file  Other Topics Concern  . Not on file  Social History Narrative  . Not on file   No Known Allergies No family history on file.   Past medical history, social, surgical and family history all reviewed in electronic medical record.  No pertanent information unless stated regarding to the chief complaint.   Review of Systems:Review of systems updated and as accurate as of 07/11/17  No headache, visual changes, nausea, vomiting, diarrhea, constipation, dizziness, abdominal pain, skin rash, fevers, chills, night sweats, weight loss, swollen lymph nodes, body aches, joint swelling, muscle aches, chest pain, shortness of breath, mood changes.   Objective  Blood pressure 128/82, pulse 76, height 5\' 6"  (1.676 m), weight 174 lb (78.9 kg), SpO2 97 %. Systems examined below as of 07/11/17   General: No apparent distress alert and oriented x3 mood and affect normal, dressed appropriately.  HEENT: Pupils equal, extraocular movements intact  Respiratory: Patient's speak in full  sentences and does not appear short of breath  Cardiovascular: No lower extremity edema, non tender, no erythema  Skin: Warm dry intact with no signs of infection or rash on extremities or on axial skeleton.  Abdomen: Soft nontender  Neuro: Cranial nerves II through XII are intact, neurovascularly intact in all extremities with 2+ DTRs and 2+ pulses.  Lymph: No lymphadenopathy of posterior or anterior cervical chain or axillae bilaterally.  Gait normal with good balance and coordination.  MSK:  Non tender with full range of motion and good stability and symmetric strength and tone of shoulders, elbows, wrist, hip, knee and ankles bilaterally.  Patient's hip exam still shows some discomfort and pain mostly over the greater trochanteric area.  Mild pain over the piriformis.   Less pain over the back than previous exam.    Impression and Recommendations:     This case required medical decision making of moderate complexity.      Note: This dictation was prepared with Dragon dictation along with smaller phrase technology. Any transcriptional errors that result from this process are unintentional.

## 2017-10-20 ENCOUNTER — Other Ambulatory Visit: Payer: Self-pay | Admitting: Family Medicine

## 2018-04-25 ENCOUNTER — Telehealth: Payer: Self-pay | Admitting: *Deleted

## 2018-04-25 NOTE — Telephone Encounter (Signed)
Ice 20 minutes 2 times daily. Usually after activity and before bed. pennsaid pinkie amount topically 2 times daily as needed.  Elevation  Tomorrow I am packed.  I can work him in next week  Otherwise Dr. Raeford Razor in the afternoon

## 2018-04-25 NOTE — Telephone Encounter (Signed)
Copied from [place] [id number]. Topic: Appointment Scheduling - Scheduling Inquiry for Clinic >> Apr 25, 2018  1:34 PM Margot Ables wrote: Reason for CRM: Pt fell this morning when running. He didn't feel bad initially. He tripped due to being dark and uneven sidewalk. He is slowly getting a knot on his right knee knot and mild limp when he is up walking. It is fine sitting down. Pt hoping for work in appt. Please call back

## 2018-04-25 NOTE — Telephone Encounter (Signed)
Spoke to pt. Scheduled him with Dr. Raeford Razor tomorrow at 1:40pm.

## 2018-04-26 ENCOUNTER — Ambulatory Visit: Payer: Managed Care, Other (non HMO) | Admitting: Family Medicine

## 2018-04-26 ENCOUNTER — Ambulatory Visit: Payer: Self-pay

## 2018-04-26 ENCOUNTER — Encounter: Payer: Self-pay | Admitting: Family Medicine

## 2018-04-26 VITALS — BP 128/78 | HR 81 | Ht 66.0 in | Wt 172.0 lb

## 2018-04-26 DIAGNOSIS — M25561 Pain in right knee: Secondary | ICD-10-CM | POA: Diagnosis not present

## 2018-04-26 NOTE — Patient Instructions (Signed)
Good to see you  Please try ice and compression  Please try to work slowly back into your exercises  Please see [place] back in 2-3 weeks if the pain is ongoing.

## 2018-04-26 NOTE — Progress Notes (Signed)
Paul [name] - 58 y.o. male MRN [id number]  Date of birth: Feb 11, 1961  SUBJECTIVE:  Including CC & ROS.  Chief Complaint  Patient presents with  . Knee Pain    right    Daxtin Leiker is a 57 y.o. male that is presenting with acute right knee pain.  He fell while he was running and landed on his left lateral leg and onto his right patella.  The pain was worse yesterday and has improved some today.  He still has pain over the anterior aspect of his right knee.  Denies any significant swelling.  Pain is moderate localized to this area.  Feels the pain over the medial aspect of his patella.  No mechanical symptoms..   Review of Systems  Constitutional: Negative for fever.  HENT: Negative for congestion.   Respiratory: Negative for cough.   Cardiovascular: Negative for chest pain.  Gastrointestinal: Negative for abdominal pain.  Musculoskeletal: Positive for gait problem.  Skin: Positive for color change.  Neurological: Negative for weakness.  Hematological: Negative for adenopathy.  Psychiatric/Behavioral: Negative for agitation.    HISTORY: Past Medical, Surgical, Social, and Family History Reviewed & Updated per EMR.   Pertinent Historical Findings include:  Past Medical History:  Diagnosis Date  . Colon cancer ([place])    1992  . Hypertension   . Sebaceous cyst     Past Surgical History:  Procedure Laterality Date  . COLON SURGERY  1992  . KNEE SURGERY  2000  . tumor removed      No Known Allergies  No family history on file.   Social History   Socioeconomic History  . Marital status: Married    Spouse name: Not on file  . Number of children: Not on file  . Years of education: Not on file  . Highest education level: Not on file  Occupational History  . Not on file  Social Needs  . Financial resource strain: Not on file  . Food insecurity:    Worry: Not on file    Inability: Not on file  . Transportation needs:    Medical: Not on file    Non-medical: Not on file   Tobacco Use  . Smoking status: Never Smoker  . Smokeless tobacco: Never Used  Substance and Sexual Activity  . Alcohol use: Yes    Alcohol/week: 3.0 standard drinks    Types: 3 Cans of beer per week    Comment: per week  . Drug use: No  . Sexual activity: Never  Lifestyle  . Physical activity:    Days per week: Not on file    Minutes per session: Not on file  . Stress: Not on file  Relationships  . Social connections:    Talks on phone: Not on file    Gets together: Not on file    Attends religious service: Not on file    Active member of club or organization: Not on file    Attends meetings of clubs or organizations: Not on file    Relationship status: Not on file  . Intimate partner violence:    Fear of current or ex partner: Not on file    Emotionally abused: Not on file    Physically abused: Not on file    Forced sexual activity: Not on file  Other Topics Concern  . Not on file  Social History Narrative  . Not on file     PHYSICAL EXAM:  VS: BP 128/78   Pulse 81  Ht 5\' 6"  (1.676 m)   Wt 172 lb (78 kg)   SpO2 99%   BMI 27.76 kg/m  Physical Exam Gen: NAD, alert, cooperative with exam, well-appearing ENT: normal lips, normal nasal mucosa,  Eye: normal EOM, normal conjunctiva and lids CV:  no edema, +2 pedal pulses   Resp: no accessory muscle use, non-labored,  Skin: no rashes, no areas of induration  Neuro: normal tone, normal sensation to touch Psych:  normal insight, alert and oriented MSK:  Right knee: Small abrasions over the right patella. Normal range of motion. No instability with valgus or varus stress testing. No pain with patellar grind. Negative McMurray's test. Neurovascularly intact  Limited ultrasound: Right knee:  Trace effusion in the suprapatellar pouch. Severe joint space narrowing in the lateral joint line Medial joint line with adequate space and normal-appearing meniscus. No fracture appreciated of the  patella. Normal-appearing quadricep and patellar tendon  Summary: Severe lateral joint space narrowing but no structural changes where his pain is occurring.  Ultrasound and interpretation by Clearance Coots, MD      ASSESSMENT & PLAN:   Acute pain of right knee Likely a contusion. No structural changes appreciated on exam or [place]. No effusion.   - counseled on supportive care - counseled on returning to exercise  - f/u if no improvement and consider imaging.

## 2018-04-26 NOTE — Assessment & Plan Note (Signed)
Likely a contusion. No structural changes appreciated on exam or [place]. No effusion.   - counseled on supportive care - counseled on returning to exercise  - f/u if no improvement and consider imaging.

## 2018-04-30 ENCOUNTER — Telehealth: Payer: Self-pay | Admitting: Family Medicine

## 2018-04-30 MED ORDER — DICLOFENAC SODIUM 2 % TD SOLN: 2.0000 g | Freq: Two times a day (BID) | TRANSDERMAL | 3 refills | Status: AC

## 2018-04-30 NOTE — Telephone Encounter (Signed)
Copied from [place] [id number]. Topic: Quick Communication - Rx Refill/Question >> Apr 30, 2018  3:14 PM Paul [name] wrote: Medication: pensaid - pt has this for his hip last year and has been using this on his knee-  he would like to know if Dr Raeford Razor would call it in for him .  It has been helping   Has the patient contacted their pharmacy? {no  (Agent: If no, request that the patient contact the pharmacy for the refill.) (Agent: If yes, when and what did the pharmacy advise?)  Preferred Pharmacy (with phone number or street name): [id number] [address], [place] [phone] (Phone)   Agent: Please be advised that RX refills may take up to 3 business days. We ask that you follow-up with your pharmacy.

## 2018-04-30 NOTE — Telephone Encounter (Signed)
Refill of pennsaid sent.   Rosemarie Ax, MD Ewa Gentry 04/30/2018, 5:02 PM

## 2018-04-30 NOTE — Addendum Note (Signed)
Addended by: Rosemarie Ax on: 04/30/2018 05:02 PM   Modules accepted: Orders

## 2018-05-13 NOTE — Progress Notes (Signed)
[name] [address] [place], [place] [address] Phone: [phone] Subjective:   Paul [name], am serving as a scribe for Dr. Hulan Saas.  I'm seeing this patient by the request  of:    CC: Right knee pain  DVV:[id number]  Paul [name] is a 58 y.o. male coming in with complaint of right knee pain. Saw another provider for same issue on 04/26/2018. Last seen by me on 07/11/2017 for hip pain. Patient states that he feel when running 2 week sago. Patient said that when he runs he feels a "loose" feeling inferior to the patella. Has been doing run walks to help decrease his pain. Has been using Pennsaid which has helped.   Also fell on the left elbow during same incident that injured his knee. Patient has pain over lateral epicondyle. Notices pain increases when he is walking his dog.       Past Medical History:  Diagnosis Date  . Colon cancer ([place])    1992  . Hypertension   . Sebaceous cyst    Past Surgical History:  Procedure Laterality Date  . COLON SURGERY  1992  . KNEE SURGERY  2000  . tumor removed     Social History   Socioeconomic History  . Marital status: Married    Spouse name: Not on file  . Number of children: Not on file  . Years of education: Not on file  . Highest education level: Not on file  Occupational History  . Not on file  Social Needs  . Financial resource strain: Not on file  . Food insecurity:    Worry: Not on file    Inability: Not on file  . Transportation needs:    Medical: Not on file    Non-medical: Not on file  Tobacco Use  . Smoking status: Never Smoker  . Smokeless tobacco: Never Used  Substance and Sexual Activity  . Alcohol use: Yes    Alcohol/week: 3.0 standard drinks    Types: 3 Cans of beer per week    Comment: per week  . Drug use: [name]  . Sexual activity: Never  Lifestyle  . Physical activity:    Days per week: Not on file    Minutes per session: Not on file  . Stress: Not on file    Relationships  . Social connections:    Talks on phone: Not on file    Gets together: Not on file    Attends religious service: Not on file    Active member of club or organization: Not on file    Attends meetings of clubs or organizations: Not on file    Relationship status: Not on file  Other Topics Concern  . Not on file  Social History Narrative  . Not on file   [name] Known Allergies [name] family history on file.   Current Outpatient Medications (Cardiovascular):  .  cholestyramine (QUESTRAN) 4 GM/DOSE powder, Take 2 g by mouth every morning.  .  hydrochlorothiazide (MICROZIDE) 12.5 MG capsule, Take 12.5 mg by mouth every morning.  Marland Kitchen  lisinopril (PRINIVIL,ZESTRIL) 20 MG tablet, Take 20 mg by mouth 2 (two) times daily.    Current Outpatient Medications (Analgesics):  .  aspirin-acetaminophen-caffeine (EXCEDRIN MIGRAINE) 269-485-46 MG per tablet, Take 1 tablet by mouth every 6 (six) hours as needed for pain. .  meloxicam (MOBIC) 15 MG tablet, Take 1 tablet (15 mg total) by mouth daily.   Current Outpatient Medications (Other):  .  Diclofenac Sodium 2 % SOLN, Place 2 g onto the skin 2 (two) times daily. Marland Kitchen  gabapentin (NEURONTIN) 100 MG capsule, TAKE 2 CAPSULES (200 MG TOTAL) BY MOUTH AT BEDTIME. Marland Kitchen  Vitamin D, Ergocalciferol, (DRISDOL) 1.25 MG (50000 UT) CAPS capsule, Take 1 capsule (50,000 Units total) by mouth every 7 (seven) days.    Past medical history, social, surgical and family history all reviewed in electronic medical record.  [name] pertanent information unless stated regarding to the chief complaint.   Review of Systems:  [name] headache, visual changes, nausea, vomiting, diarrhea, constipation, dizziness, abdominal pain, skin rash, fevers, chills, night sweats, weight loss, swollen lymph nodes,chest pain, shortness of breath, mood changes.  [name] muscle aches, joint swelling, body aches  Objective  Blood pressure 120/88, pulse 86, height 5\' 6"  (1.676 m), weight 191 lb (86.6  kg), SpO2 98 %.    General: [name] apparent distress alert and oriented x3 mood and affect normal, dressed appropriately.  HEENT: Pupils equal, extraocular movements intact  Respiratory: Patient's speak in full sentences and does not appear short of breath  Cardiovascular: [name] lower extremity edema, non tender, [name] erythema  Skin: Warm dry intact with [name] signs of infection or rash on extremities or on axial skeleton.  Abdomen: Soft nontender  Neuro: Cranial nerves II through XII are intact, neurovascularly intact in all extremities with 2+ DTRs and 2+ pulses.  Lymph: [name] lymphadenopathy of posterior or anterior cervical chain or axillae bilaterally.  Gait antalgic MSK:  Non tender with full range of motion and good stability and symmetric strength and tone of shoulders, , wrist, hip, and ankles bilaterally.   Knee:right  Varus deformity noted.  Abnormal thigh to calf ratio.  Tender to palpation over lateral and PF joint line.  ROM lacks last 5 degrees of extension instability with varus force force.  painful patellar compression. Patellar glide with moderate crepitus. Patellar and quadriceps tendons unremarkable. Hamstring and quadriceps strength is normal. Contralateral knee shows mild arthritic changes but good stability  Elbow: Left Unremarkable to inspection. Range of motion full pronation, supination, flexion, extension. Strength is full to all of the above directions Stable to varus, valgus stress. Negative moving valgus stress test. It is over the lateral and posterior aspect of the elbow. Ulnar nerve does not sublux. Negative cubital tunnel Tinel's. contralateral elbow unremarkable  Limited musculoskeletal ultrasound was performed and interpreted by Lyndal Pulley  Limited ultrasound of patient's knee shows severe bone-on-bone osteoarthritic changes of the lateral compartment and moderate patellofemoral.  Trace effusion noted.  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   Impression and Recommendations:     This case required medical decision making of moderate complexity. The above documentation has been reviewed and is accurate and complete Lyndal Pulley, DO       Note: This dictation was prepared with Dragon dictation along with smaller phrase technology. Any transcriptional errors that result from this process are unintentional.

## 2018-05-14 ENCOUNTER — Ambulatory Visit: Payer: Self-pay

## 2018-05-14 ENCOUNTER — Ambulatory Visit: Payer: Managed Care, Other (non HMO) | Admitting: Family Medicine

## 2018-05-14 ENCOUNTER — Encounter: Payer: Self-pay | Admitting: Family Medicine

## 2018-05-14 VITALS — BP 120/88 | HR 86 | Ht 66.0 in | Wt 191.0 lb

## 2018-05-14 DIAGNOSIS — S5002XA Contusion of left elbow, initial encounter: Secondary | ICD-10-CM

## 2018-05-14 DIAGNOSIS — M25561 Pain in right knee: Secondary | ICD-10-CM

## 2018-05-14 DIAGNOSIS — M1711 Unilateral primary osteoarthritis, right knee: Secondary | ICD-10-CM

## 2018-05-14 MED ORDER — VITAMIN D (ERGOCALCIFEROL) 1.25 MG (50000 UNIT) PO CAPS: 50000.0000 [IU] | ORAL_CAPSULE | ORAL | 0 refills | Status: AC

## 2018-05-14 NOTE — Assessment & Plan Note (Signed)
Instability of the knee.  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  We will get a lateral unloader brace secondary to patient's abnormal thigh to calf ratio and instability of the knee.  Patient is an avid runner and would like to continue to run for another 5 to 7 years.  Hopefully this will be possible.  Discussed the possibility of injections if needed.  Once weekly vitamin D given, discussed icing regimen and home exercises.  Topical anti-inflammatories.  I believe the patient's most recent injury is more to the exacerbation of the underlying arthritis as well as some malignant contusion.  Follow-up again in 4 weeks

## 2018-05-14 NOTE — Assessment & Plan Note (Signed)
More likely contusion.  Discussed icing regimen and home exercise.  Discussed which activities of doing which will still avoid.  Discussed compression.  Follow-up again in 4 weeks

## 2018-05-14 NOTE — Patient Instructions (Addendum)
Good to see you  Ice is your friend Arnica lotion 2 times daily to the knee and the elbow Once weekly vitamin D for 12 weeks Pennsaid if you need it For the elbow lift only underhand or thumbs up  Until you get a brace consider a patella strap with running to take a little vibration out of the knee.  Ok to run but will give some discomfort for some more weeks See me again in 4 weeks

## 2018-06-11 ENCOUNTER — Ambulatory Visit: Payer: Managed Care, Other (non HMO) | Admitting: Family Medicine

## 2018-06-11 ENCOUNTER — Encounter: Payer: Self-pay | Admitting: Family Medicine

## 2018-06-11 ENCOUNTER — Other Ambulatory Visit: Payer: Self-pay

## 2018-06-11 DIAGNOSIS — M1711 Unilateral primary osteoarthritis, right knee: Secondary | ICD-10-CM

## 2018-06-11 NOTE — Assessment & Plan Note (Signed)
Stable overall.  Exercise.  Discussed which activities of doing which was to evaluate.  Patient is well-developed injections.

## 2018-06-11 NOTE — Progress Notes (Signed)
[name] [address], [place] [address] Phone: [phone] Subjective:      CC: Patient does knee pain  follow-up  URK:[id number]   05/14/2018: Instability of the knee.  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  We will get a lateral unloader brace secondary to patient's abnormal thigh to calf ratio and instability of the knee.  Patient is an avid runner and would like to continue to run for another 5 to 7 years.  Hopefully this will be possible.  Discussed the possibility of injections if needed.  Once weekly vitamin D given, discussed icing regimen and home exercises.  Topical anti-inflammatories.  I believe the patient's most recent injury is more to the exacerbation of the underlying arthritis as well as some malignant contusion.  Follow-up again in 4 weeks  Update 06/11/2018: Paul [name] is a 58 y.o. male coming in with complaint of right knee pain. Patient states that he has been running. He isn't back to running continuous but has been performing intermittent run walks. Is using a brace to alleviate his pain. Awaits custom from [employer].  Patient states that 90% better.  Not having as much swelling, feels slightly better range of motion.  Still have off-and-on instability.    Past Medical History:  Diagnosis Date  . Colon cancer ([place])    1992  . Hypertension   . Sebaceous cyst    Past Surgical History:  Procedure Laterality Date  . COLON SURGERY  1992  . KNEE SURGERY  2000  . tumor removed     Social History   Socioeconomic History  . Marital status: Married    Spouse name: Not on file  . Number of children: Not on file  . Years of education: Not on file  . Highest education level: Not on file  Occupational History  . Not on file  Social Needs  . Financial resource strain: Not on file  . Food insecurity:    Worry: Not on file    Inability: Not on file  . Transportation needs:    Medical: Not  on file    Non-medical: Not on file  Tobacco Use  . Smoking status: Never Smoker  . Smokeless tobacco: Never Used  Substance and Sexual Activity  . Alcohol use: Yes    Alcohol/week: 3.0 standard drinks    Types: 3 Cans of beer per week    Comment: per week  . Drug use: No  . Sexual activity: Never  Lifestyle  . Physical activity:    Days per week: Not on file    Minutes per session: Not on file  . Stress: Not on file  Relationships  . Social connections:    Talks on phone: Not on file    Gets together: Not on file    Attends religious service: Not on file    Active member of club or organization: Not on file    Attends meetings of clubs or organizations: Not on file    Relationship status: Not on file  Other Topics Concern  . Not on file  Social History Narrative  . Not on file   No Known Allergies No family history on file.   Current Outpatient Medications (Cardiovascular):  .  cholestyramine (QUESTRAN) 4 GM/DOSE powder, Take 2 g by mouth every morning.  .  hydrochlorothiazide (MICROZIDE) 12.5 MG capsule, Take 12.5 mg by mouth every morning.  Marland Kitchen  lisinopril (PRINIVIL,ZESTRIL) 20 MG tablet, Take 20  mg by mouth 2 (two) times daily.    Current Outpatient Medications (Analgesics):  .  aspirin-acetaminophen-caffeine (EXCEDRIN MIGRAINE) 790-383-33 MG per tablet, Take 1 tablet by mouth every 6 (six) hours as needed for pain. .  meloxicam (MOBIC) 15 MG tablet, Take 1 tablet (15 mg total) by mouth daily.   Current Outpatient Medications (Other):  Marland Kitchen  Diclofenac Sodium 2 % SOLN, Place 2 g onto the skin 2 (two) times daily. Marland Kitchen  gabapentin (NEURONTIN) 100 MG capsule, TAKE 2 CAPSULES (200 MG TOTAL) BY MOUTH AT BEDTIME. Marland Kitchen  Vitamin D, Ergocalciferol, (DRISDOL) 1.25 MG (50000 UT) CAPS capsule, Take 1 capsule (50,000 Units total) by mouth every 7 (seven) days.    Past medical history, social, surgical and family history all reviewed in electronic medical record.  No pertanent  information unless stated regarding to the chief complaint.   Review of Systems:  No headache, visual changes, nausea, vomiting, diarrhea, constipation, dizziness, abdominal pain, skin rash, fevers, chills, night sweats, weight loss, swollen lymph nodes,, chest pain, shortness of breath, mood changes.  Mild positive muscle aches and joint swelling  Objective  Blood pressure 110/78, pulse 69, height 5\' 6"  (1.676 m), weight 191 lb (86.6 kg), SpO2 98 %.   General: No apparent distress alert and oriented x3 mood and affect normal, dressed appropriately.  HEENT: Pupils equal, extraocular movements intact  Respiratory: Patient's speak in full sentences and does not appear short of breath  Cardiovascular: No lower extremity edema, non tender, no erythema  Skin: Warm dry intact with no signs of infection or rash on extremities or on axial skeleton.  Abdomen: Soft nontender  Neuro: Cranial nerves II through XII are intact, neurovascularly intact in all extremities with 2+ DTRs and 2+ pulses.  Lymph: No lymphadenopathy of posterior or anterior cervical chain or axillae bilaterally.  Gait mild antalgic MSK:  Non tender with full range of motion and good stability and symmetric strength and tone of shoulders, elbows, wrist, hip and ankles bilaterally.  Right knee exam does show patient does have some arthritic changes.  Varus deformity noted to the knee.  Crepitus noted with range of motion and instability noted.  Abnormal thigh to calf ratio noted extension.      Impression and Recommendations:     . The above documentation has been reviewed and is accurate and complete Lyndal Pulley, DO       Note: This dictation was prepared with Dragon dictation along with smaller phrase technology. Any transcriptional errors that result from this process are unintentional.

## 2018-12-11 IMAGING — DX DG HIP (WITH OR WITHOUT PELVIS) 2-3V*R*
2 series · 2 of 2 positions shown · non-contrast
Comparison: None.

CLINICAL DATA: Right hip pain

EXAM:
DG HIP (WITH OR WITHOUT PELVIS) 2-3V RIGHT

[hip ap]
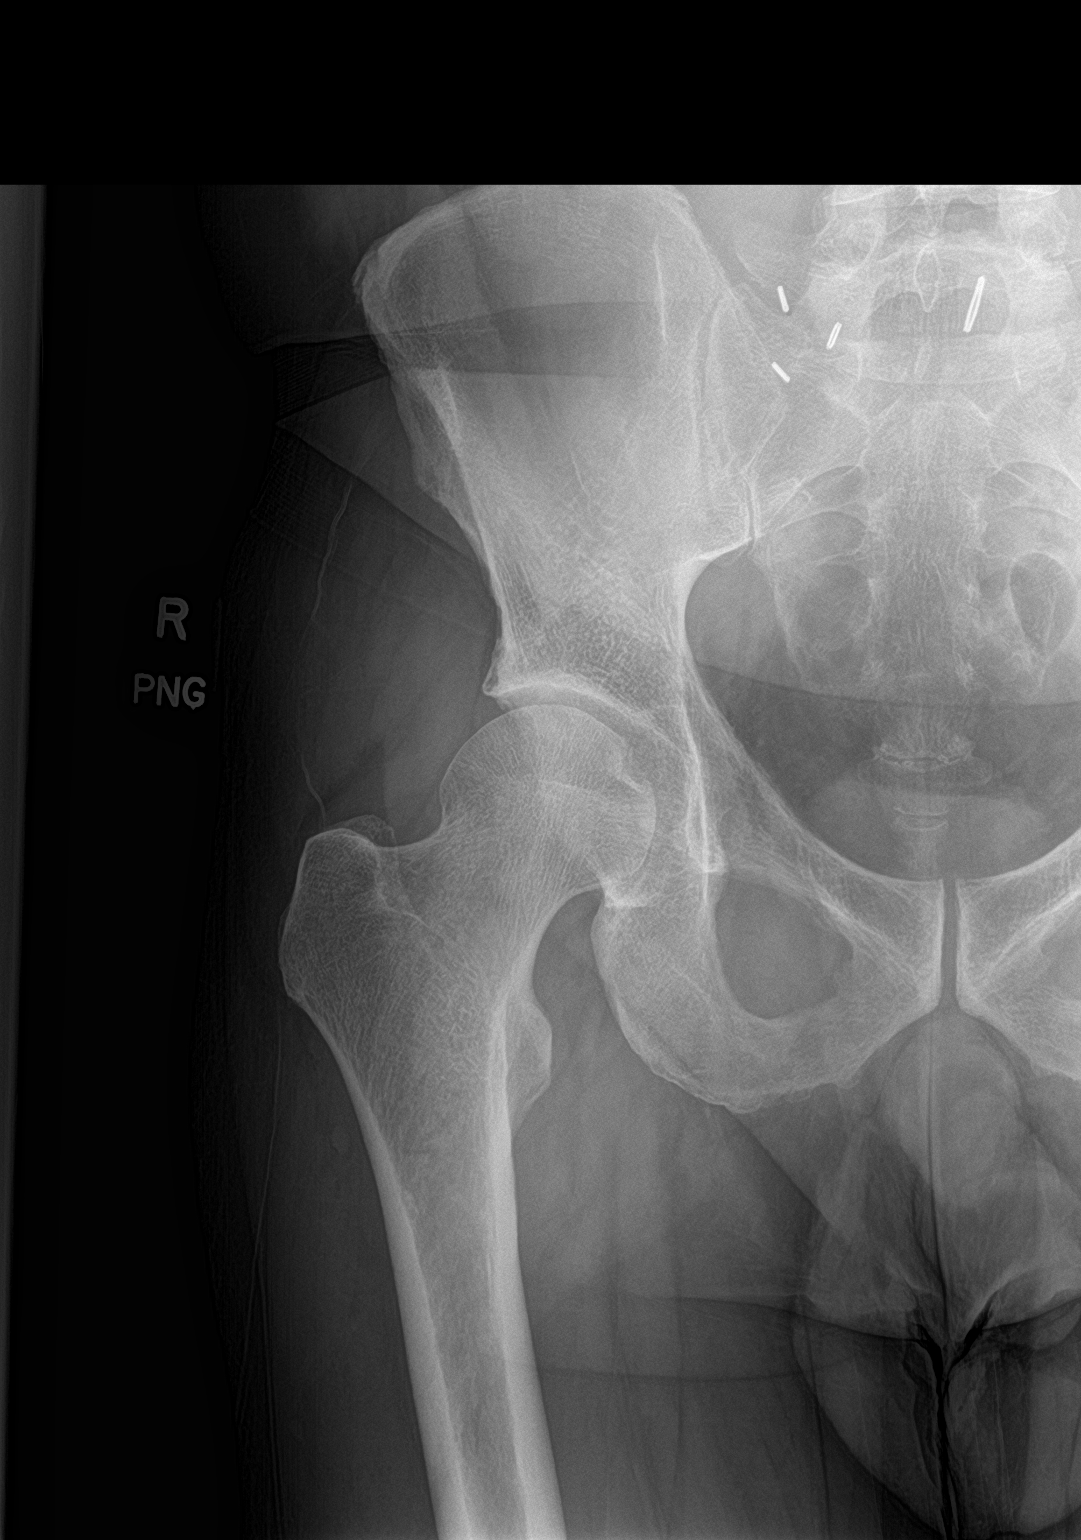

[hip lat]
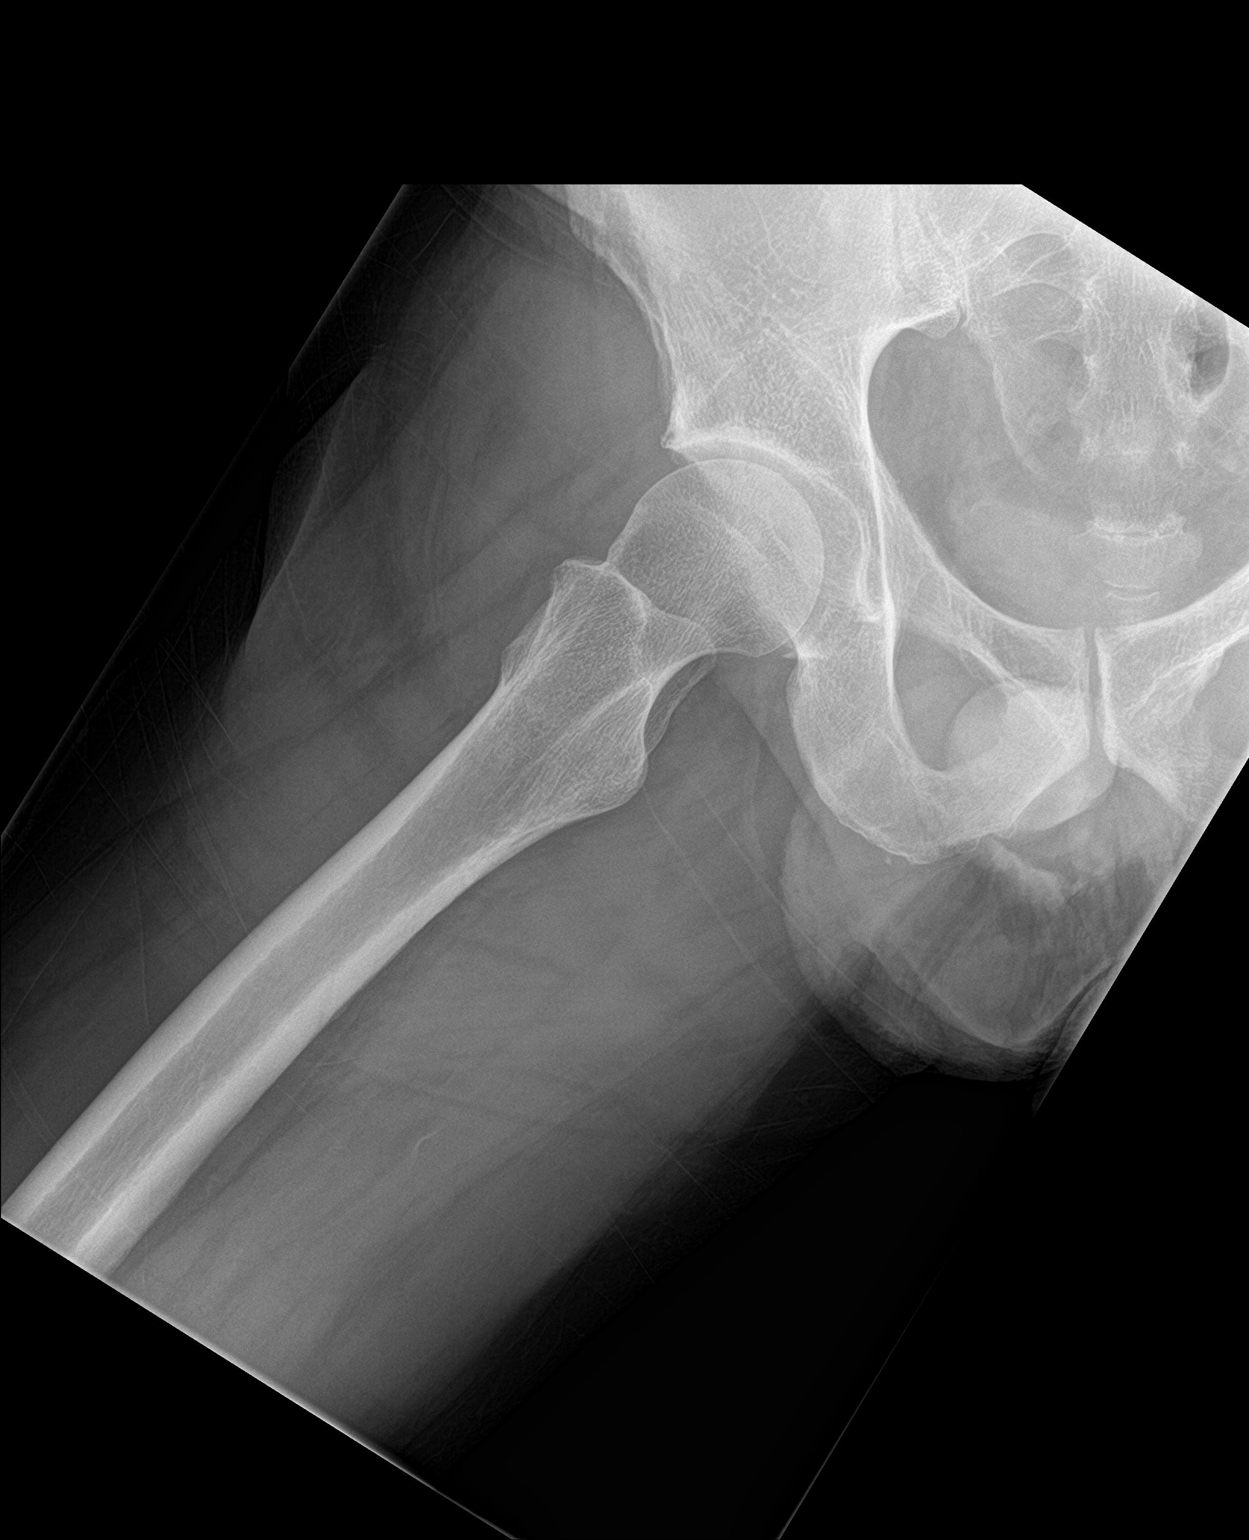

[2 of 2 positions shown; findings below may reference images not displayed]

FINDINGS: There is no evidence of hip fracture or dislocation. There is no
evidence of arthropathy or other focal bone abnormality.
IMPRESSION: Negative.

## 2018-12-11 IMAGING — DX DG LUMBAR SPINE 2-3V
3 series · 3 of 3 positions shown · non-contrast
Comparison: None

CLINICAL DATA: Low back pain

EXAM:
LUMBAR SPINE - 2-3 VIEW

[l-spine ap]
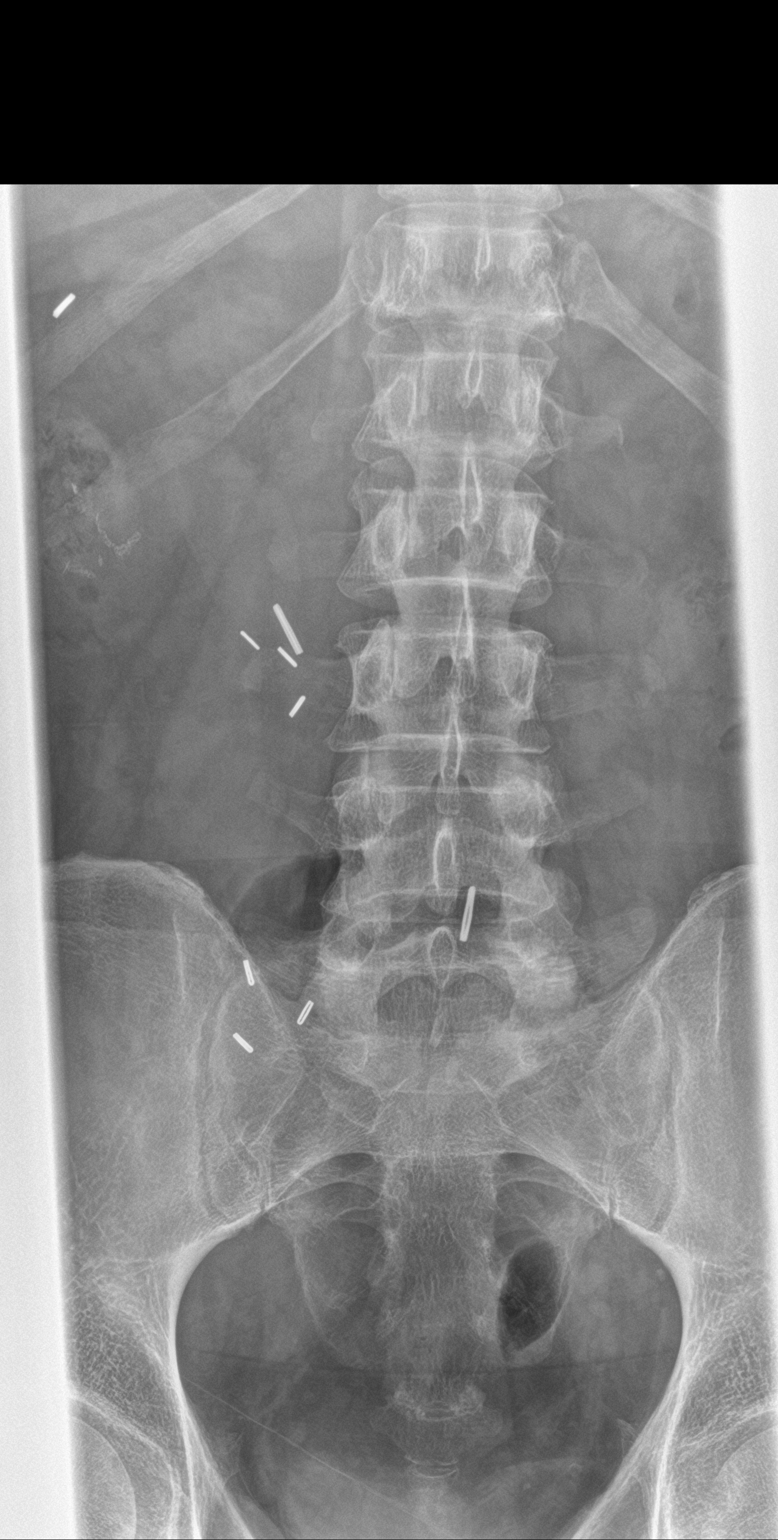

[l-spine lat]
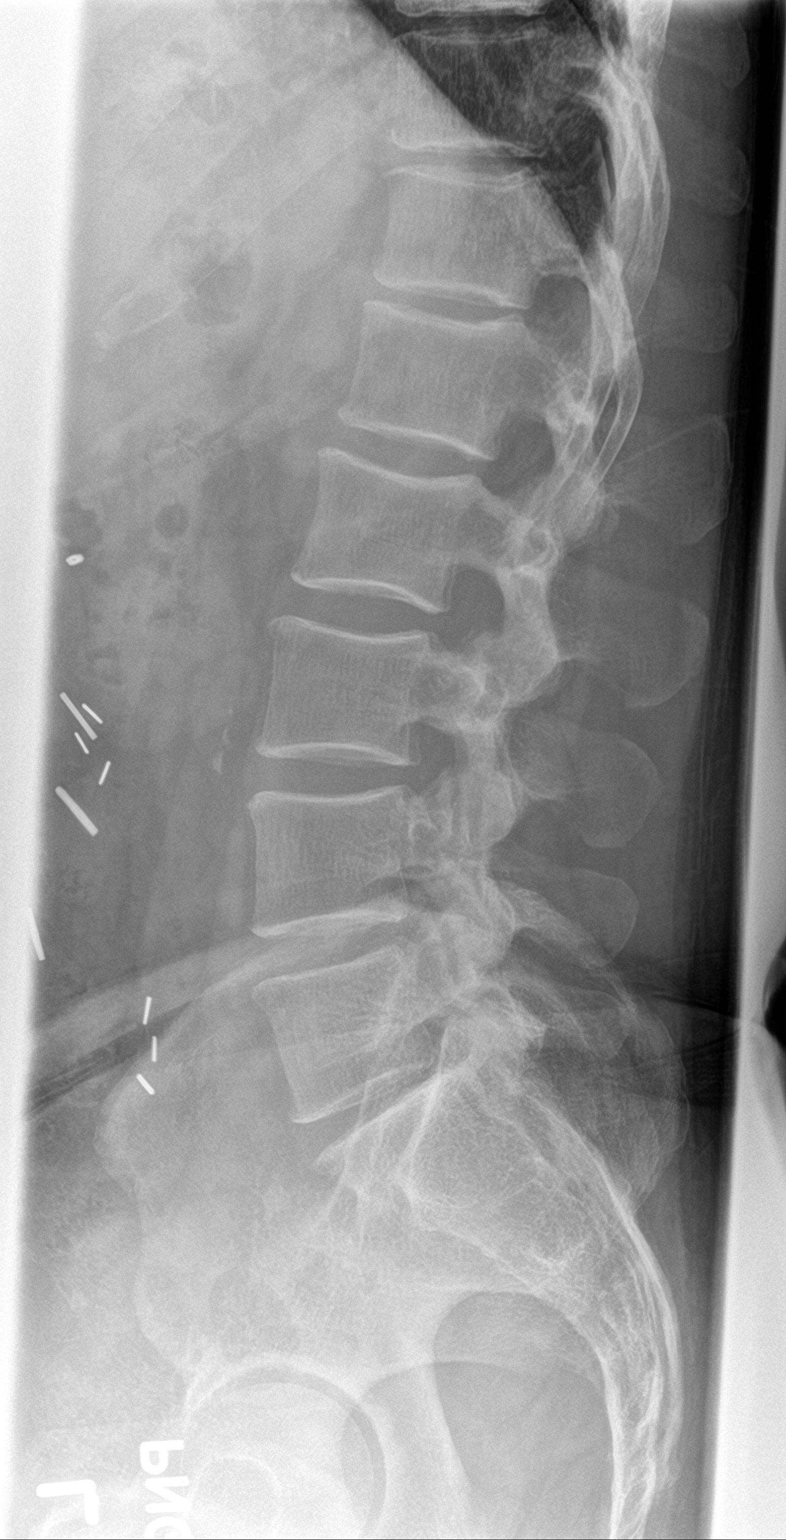

[l-spine spot]
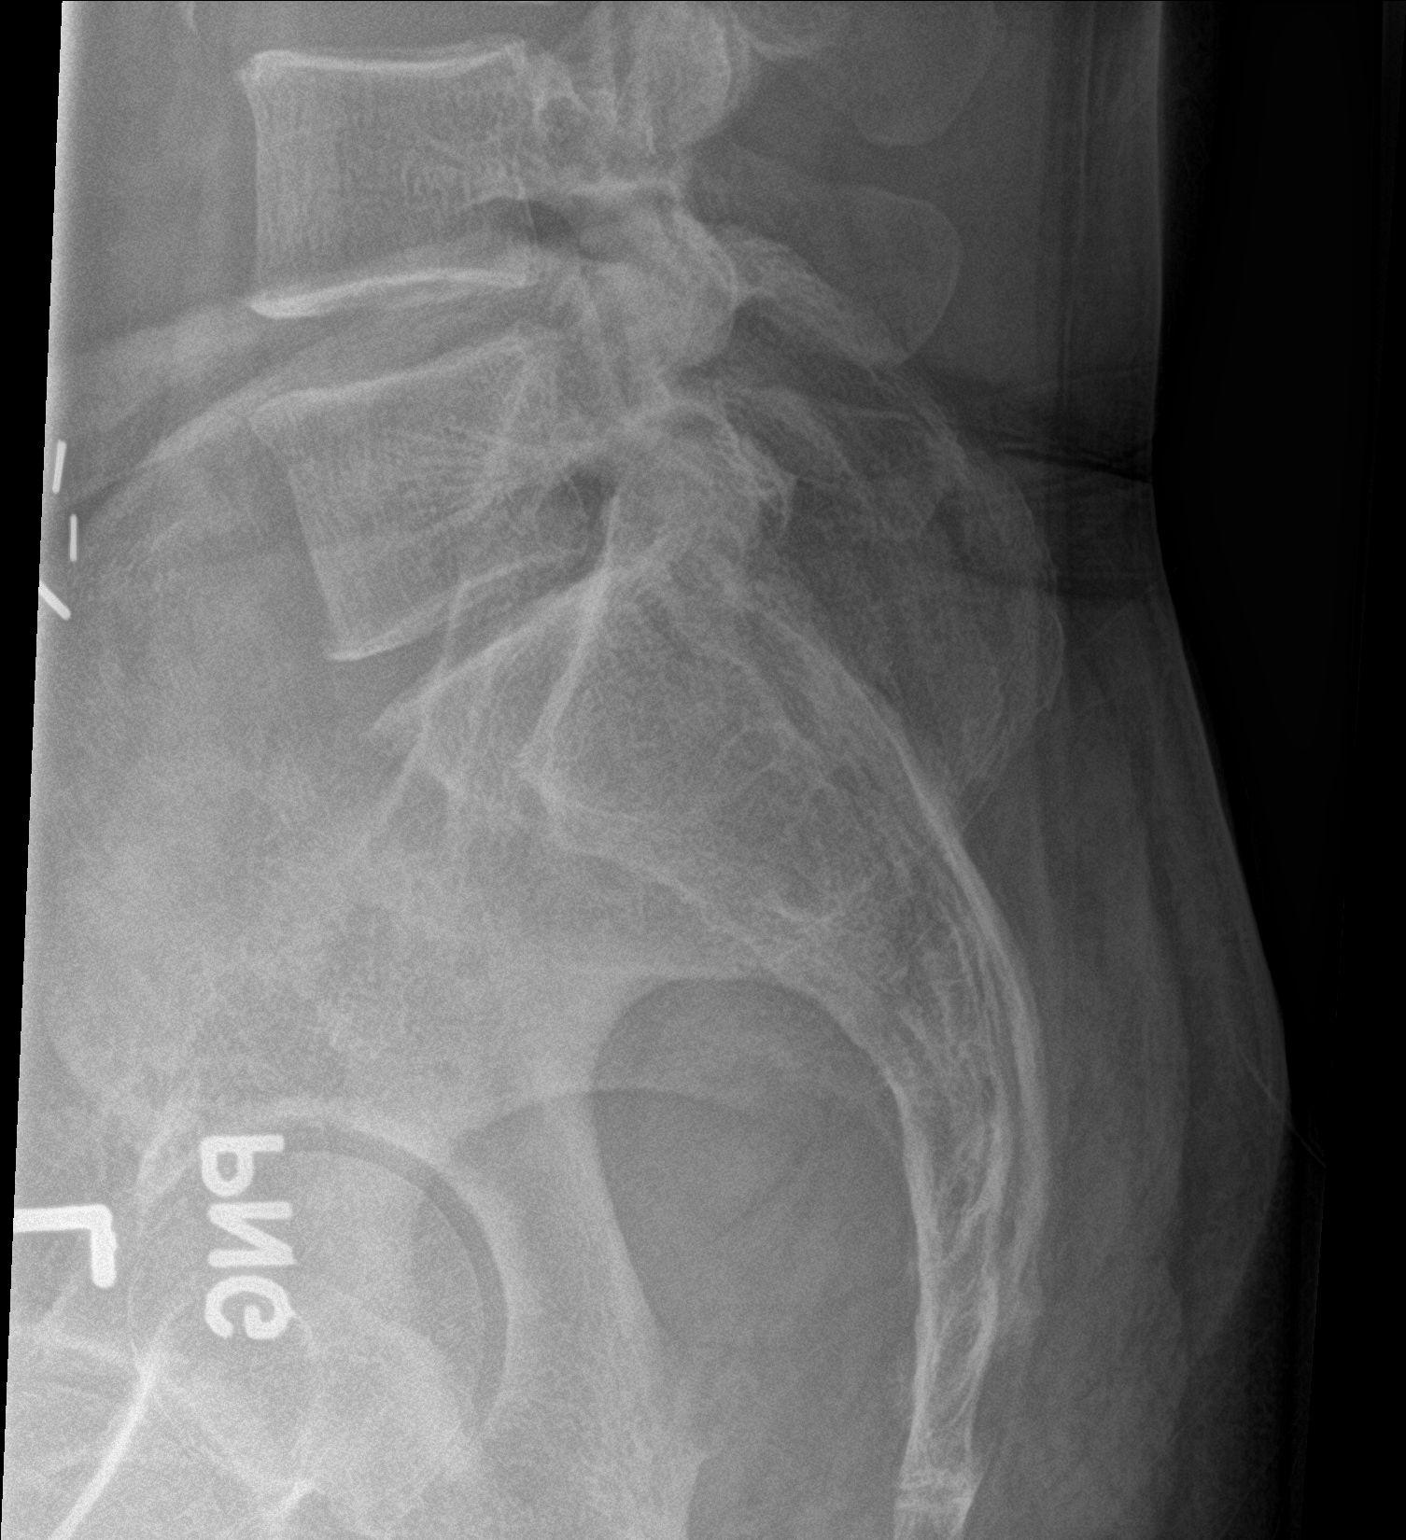

[3 of 3 positions shown; findings below may reference images not displayed]

FINDINGS: Disc space narrowing at L5-S1. Normal alignment. No fracture. SI
joints are symmetric and unremarkable.
IMPRESSION: Early degenerative disc changes at L5-S1.  No acute bony abnormality

## 2019-01-31 ENCOUNTER — Other Ambulatory Visit: Payer: Self-pay

## 2019-01-31 DIAGNOSIS — Z20822 Contact with and (suspected) exposure to covid-19: Secondary | ICD-10-CM

## 2019-02-01 LAB — NOVEL CORONAVIRUS, NAA: SARS-CoV-2, NAA: NOT DETECTED

## 2019-05-30 ENCOUNTER — Ambulatory Visit: Payer: Managed Care, Other (non HMO) | Attending: Internal Medicine

## 2019-05-30 DIAGNOSIS — Z23 Encounter for immunization: Secondary | ICD-10-CM | POA: Insufficient documentation

## 2019-05-30 NOTE — Progress Notes (Signed)
   Covid-19 Vaccination Clinic  Name:  Dana Bestor    MRN: [id number] DOB: 06-13-1960  05/30/2019  Mr. Haydu was observed post Covid-19 immunization for 15 minutes without incident. He was provided with Vaccine Information Sheet and instruction to access the V-Safe system.   Mr. Congdon was instructed to call 911 with any severe reactions post vaccine: Marland Kitchen Difficulty breathing  . Swelling of face and throat  . A fast heartbeat  . A bad rash all over body  . Dizziness and weakness   Immunizations Administered    Name Date Dose VIS Date Route   Pfizer COVID-19 Vaccine 05/30/2019 10:06 AM 0.3 mL 03/07/2019 Intramuscular   Manufacturer: [place]   Lot: [id number]   [place]: [id number]

## 2019-06-25 ENCOUNTER — Ambulatory Visit: Payer: Managed Care, Other (non HMO) | Attending: Internal Medicine

## 2019-06-25 DIAGNOSIS — Z23 Encounter for immunization: Secondary | ICD-10-CM

## 2019-06-25 NOTE — Progress Notes (Signed)
   Covid-19 Vaccination Clinic  Name:  Paul [name]    MRN: [id number] DOB: 1960-10-29  06/25/2019  [name] was observed post Covid-19 immunization for 15 minutes without incident. He was provided with Vaccine Information Sheet and instruction to access the V-Safe system.   [name] was instructed to call 911 with any severe reactions post vaccine: Marland Kitchen Difficulty breathing  . Swelling of face and throat  . A fast heartbeat  . A bad rash all over body  . Dizziness and weakness   Immunizations Administered    Name Date Dose VIS Date Route   Pfizer COVID-19 Vaccine 06/25/2019 12:56 PM 0.3 mL 03/07/2019 Intramuscular   Manufacturer: [place]   Lot: [id number]   [place]: [id number]

## 2019-12-03 ENCOUNTER — Encounter: Payer: Self-pay | Admitting: Family Medicine

## 2019-12-03 ENCOUNTER — Ambulatory Visit (INDEPENDENT_AMBULATORY_CARE_PROVIDER_SITE_OTHER): Payer: Managed Care, Other (non HMO)

## 2019-12-03 ENCOUNTER — Ambulatory Visit: Payer: Managed Care, Other (non HMO) | Admitting: Family Medicine

## 2019-12-03 ENCOUNTER — Ambulatory Visit: Payer: Self-pay

## 2019-12-03 ENCOUNTER — Other Ambulatory Visit: Payer: Self-pay

## 2019-12-03 VITALS — BP 132/92 | HR 67 | Ht 66.0 in

## 2019-12-03 DIAGNOSIS — S83002A Unspecified subluxation of left patella, initial encounter: Secondary | ICD-10-CM | POA: Diagnosis not present

## 2019-12-03 DIAGNOSIS — M25562 Pain in left knee: Secondary | ICD-10-CM

## 2019-12-03 MED ORDER — DICLOFENAC SODIUM 2 % EX SOLN: 2.0000 g | Freq: Two times a day (BID) | CUTANEOUS | 3 refills | Status: AC

## 2019-12-03 NOTE — Patient Instructions (Addendum)
Xray Pennsaid Brace with activity Exercises See me in 6-8 weeks

## 2019-12-03 NOTE — Progress Notes (Signed)
Paul [name] [place] Phone: [phone] Subjective:   Paul [name], am serving as a scribe for Dr. Hulan Saas. This visit occurred during the SARS-CoV-2 public health emergency.  Safety protocols were in place, including screening questions prior to the visit, additional usage of staff PPE, and extensive cleaning of exam room while observing appropriate contact time as indicated for disinfecting solutions.   I'm seeing this patient by the request  of:  Hulan Fess, MD  CC: Left knee pain  TDD:[id number]  Paul [name] is a 59 y.o. male coming in with complaint of bilateral  knee pain. Previously seen for arthritis of right knee. Pain intermittent in right knee.  Patient states he has been training for 10k. Two weeks ago he developed pain in anterior aspect of left knee over patella. Felt a twinge of pain and has taken it easy for a few weeks. Saturday he did a bootcamp workout and is able to do squats without pain. Took last week off.      Past Medical History:  Diagnosis Date  . Colon cancer ([place])    1992  . Hypertension   . Sebaceous cyst    Past Surgical History:  Procedure Laterality Date  . COLON SURGERY  1992  . KNEE SURGERY  2000  . tumor removed     Social History   Socioeconomic History  . Marital status: Married    Spouse name: Not on file  . Number of children: Not on file  . Years of education: Not on file  . Highest education level: Not on file  Occupational History  . Not on file  Tobacco Use  . Smoking status: Never Smoker  . Smokeless tobacco: Never Used  Substance and Sexual Activity  . Alcohol use: Yes    Alcohol/week: 3.0 standard drinks    Types: 3 Cans of beer per week    Comment: per week  . Drug use: [name]  . Sexual activity: Never  Other Topics Concern  . Not on file  Social History Narrative  . Not on file   Social Determinants of Health   Financial Resource Strain:   .  Difficulty of Paying Living Expenses: Not on file  Food Insecurity:   . Worried About Charity fundraiser in the Last Year: Not on file  . Ran Out of Food in the Last Year: Not on file  Transportation Needs:   . Lack of Transportation (Medical): Not on file  . Lack of Transportation (Non-Medical): Not on file  Physical Activity:   . Days of Exercise per Week: Not on file  . Minutes of Exercise per Session: Not on file  Stress:   . Feeling of Stress : Not on file  Social Connections:   . Frequency of Communication with Friends and Family: Not on file  . Frequency of Social Gatherings with Friends and Family: Not on file  . Attends Religious Services: Not on file  . Active Member of Clubs or Organizations: Not on file  . Attends Archivist Meetings: Not on file  . Marital Status: Not on file   [name] Known Allergies [name] family history on file.   Current Outpatient Medications (Cardiovascular):  .  cholestyramine (QUESTRAN) 4 GM/DOSE powder, Take 2 g by mouth every morning.  .  hydrochlorothiazide (MICROZIDE) 12.5 MG capsule, Take 12.5 mg by mouth every morning.  Marland Kitchen  lisinopril (PRINIVIL,ZESTRIL) 20 MG tablet, Take 20 mg by  mouth 2 (two) times daily.    Current Outpatient Medications (Analgesics):  .  aspirin-acetaminophen-caffeine (EXCEDRIN MIGRAINE) 975-883-25 MG per tablet, Take 1 tablet by mouth every 6 (six) hours as needed for pain. .  meloxicam (MOBIC) 15 MG tablet, Take 1 tablet (15 mg total) by mouth daily.   Current Outpatient Medications (Other):  Marland Kitchen  Diclofenac Sodium 2 % SOLN, Place 2 g onto the skin 2 (two) times daily. Marland Kitchen  gabapentin (NEURONTIN) 100 MG capsule, TAKE 2 CAPSULES (200 MG TOTAL) BY MOUTH AT BEDTIME. Marland Kitchen  Vitamin D, Ergocalciferol, (DRISDOL) 1.25 MG (50000 UT) CAPS capsule, Take 1 capsule (50,000 Units total) by mouth every 7 (seven) days. .  Diclofenac Sodium 2 % SOLN, Place 2 g onto the skin 2 (two) times daily.   Reviewed prior external information  including notes and imaging from  primary care provider As well as notes that were available from care everywhere and other healthcare systems.  Past medical history, social, surgical and family history all reviewed in electronic medical record.  [name] pertanent information unless stated regarding to the chief complaint.   Review of Systems:  [name] headache, visual changes, nausea, vomiting, diarrhea, constipation, dizziness, abdominal pain, skin rash, fevers, chills, night sweats, weight loss, swollen lymph nodes, body aches, joint swelling, chest pain, shortness of breath, mood changes. POSITIVE muscle aches  Objective  Blood pressure (!) 132/92, pulse 67, height 5\' 6"  (1.676 m), SpO2 98 %.   General: [name] apparent distress alert and oriented x3 mood and affect normal, dressed appropriately.  HEENT: Pupils equal, extraocular movements intact  Respiratory: Patient's speak in full sentences and does not appear short of breath  Cardiovascular: [name] lower extremity edema, non tender, [name] erythema  Neuro: Cranial nerves II through XII are intact, neurovascularly intact in all extremities with 2+ DTRs and 2+ pulses.  Gait normal with good balance and coordination.  MSK:   Left knee exam shows the patient does have some mild lateral tracking of the patella noted.  [name] effusion noted, lacks about 5 degrees of extension noted. Limited musculoskeletal ultrasound was performed and interpreted by Lyndal Pulley  Limited ultrasound of patient's left knee shows the patient does have some mild trace effusion noted of the patellofemoral area.  Mild/moderate narrowing of the patellofemoral joint space.  Mild narrowing of the lateral joint space with fairly unremarkable medial joint space.  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   Impression and Recommendations:     The above documentation has been reviewed and is accurate and complete  Lyndal Pulley, DO       Note: This dictation was prepared with Dragon dictation along with smaller phrase technology. Any transcriptional errors that result from this process are unintentional.

## 2019-12-03 NOTE — Assessment & Plan Note (Signed)
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  Discussed with patient about icing regimen and home exercise.  Patient could be compensating as well for the arthritic changes of the lateral compartment on the contralateral knee.  Patient is having custom brace adjusted.  We discussed potential injection but I do not think he will need it.  Tru pull lite brace given for the left knee, home exercises for more stability of the patella.  Follow-up with me again 6 weeks

## 2019-12-26 ENCOUNTER — Ambulatory Visit: Payer: Managed Care, Other (non HMO) | Admitting: Family Medicine

## 2020-01-19 NOTE — Progress Notes (Signed)
[facility] [address] [place] Phone: [phone] Subjective:   I Kandace Blitz am serving as a Education administrator for Dr. Hulan Saas.  This visit occurred during the SARS-CoV-2 public health emergency.  Safety protocols were in place, including screening questions prior to the visit, additional usage of staff PPE, and extensive cleaning of exam room while observing appropriate contact time as indicated for disinfecting solutions.   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  Discussed with patient about icing regimen and home exercise.  Patient could be compensating as well for the arthritic changes of the lateral compartment on the contralateral knee.  Patient is having custom brace adjusted.  We discussed potential injection but I do not think he will need it.  Tru pull lite brace given for the left knee, home exercises for more stability of the patella.  Follow-up with me again 6 weeks  Update 01/20/2020 Georgio Hattabaugh is a 59 y.o. male coming in with complaint of left knee pain. Patient states he feels 100%. Wearing the knee brace all of the time. Patient states able to run and has not been done a half marathon since we have seen him.      Past Medical History:  Diagnosis Date  . Colon cancer ([place])    1992  . Hypertension   . Sebaceous cyst    Past Surgical History:  Procedure Laterality Date  . COLON SURGERY  1992  . KNEE SURGERY  2000  . tumor removed     Social History   Socioeconomic History  . Marital status: Married    Spouse name: Not on file  . Number of children: Not on file  . Years of education: Not on file  . Highest education level: Not on file  Occupational History  . Not on file  Tobacco Use  . Smoking status: Never Smoker  . Smokeless tobacco: Never Used  Substance and Sexual Activity  .  Alcohol use: Yes    Alcohol/week: 3.0 standard drinks    Types: 3 Cans of beer per week    Comment: per week  . Drug use: No  . Sexual activity: Never  Other Topics Concern  . Not on file  Social History Narrative  . Not on file   Social Determinants of Health   Financial Resource Strain:   . Difficulty of Paying Living Expenses: Not on file  Food Insecurity:   . Worried About Charity fundraiser in the Last Year: Not on file  . Ran Out of Food in the Last Year: Not on file  Transportation Needs:   . Lack of Transportation (Medical): Not on file  . Lack of Transportation (Non-Medical): Not on file  Physical Activity:   . Days of Exercise per Week: Not on file  . Minutes of Exercise per Session: Not on file  Stress:   . Feeling of Stress : Not on file  Social Connections:   . Frequency of Communication with Friends and Family: Not on file  . Frequency of Social Gatherings with Friends and Family: Not on file  . Attends Religious Services: Not on file  . Active Member of Clubs or Organizations: Not on file  . Attends Archivist Meetings: Not on file  . Marital Status: Not on file   No Known Allergies  No family history on file.   Current Outpatient Medications (Cardiovascular):  .  cholestyramine (QUESTRAN) 4 GM/DOSE powder, Take 2 g by mouth every morning.  .  hydrochlorothiazide (MICROZIDE) 12.5 MG capsule, Take 12.5 mg by mouth every morning.  Marland Kitchen  lisinopril (PRINIVIL,ZESTRIL) 20 MG tablet, Take 20 mg by mouth 2 (two) times daily.    Current Outpatient Medications (Analgesics):  .  aspirin-acetaminophen-caffeine (EXCEDRIN MIGRAINE) 956-213-08 MG per tablet, Take 1 tablet by mouth every 6 (six) hours as needed for pain. .  meloxicam (MOBIC) 15 MG tablet, Take 1 tablet (15 mg total) by mouth daily.   Current Outpatient Medications (Other):  Marland Kitchen  Diclofenac Sodium 2 % SOLN, Place 2 g onto the skin 2 (two) times daily. .  Diclofenac Sodium 2 % SOLN, Place 2 g  onto the skin 2 (two) times daily. Marland Kitchen  gabapentin (NEURONTIN) 100 MG capsule, TAKE 2 CAPSULES (200 MG TOTAL) BY MOUTH AT BEDTIME. Marland Kitchen  Vitamin D, Ergocalciferol, (DRISDOL) 1.25 MG (50000 UT) CAPS capsule, Take 1 capsule (50,000 Units total) by mouth every 7 (seven) days.   Reviewed prior external information including notes and imaging from  primary care provider As well as notes that were available from care everywhere and other healthcare systems.  Past medical history, social, surgical and family history all reviewed in electronic medical record.  No pertanent information unless stated regarding to the chief complaint.   Review of Systems:  No headache, visual changes, nausea, vomiting, diarrhea, constipation, dizziness, abdominal pain, skin rash, fevers, chills, night sweats, weight loss, swollen lymph nodes, body aches, joint swelling, chest pain, shortness of breath, mood changes. POSITIVE muscle aches  Objective  Blood pressure 140/80, pulse 70, height 5\' 6"  (1.676 m), weight 177 lb (80.3 kg), SpO2 97 %.   General: No apparent distress alert and oriented x3 mood and affect normal, dressed appropriately.  HEENT: Pupils equal, extraocular movements intact  Respiratory: Patient's speak in full sentences and does not appear short of breath  Cardiovascular: No lower extremity edema, non tender, no erythema  Gait normal with good balance and coordination.  MSK: . Left knee exam shows the patient does have some mild tenderness to palpation very moderately over the patellofemoral but otherwise unremarkable. Good stability noted. Still arthritic changes noted of the right knee.    Impression and Recommendations:     The above documentation has been reviewed and is accurate and complete Lyndal Pulley, DO

## 2020-01-20 ENCOUNTER — Other Ambulatory Visit: Payer: Self-pay

## 2020-01-20 ENCOUNTER — Ambulatory Visit: Payer: Managed Care, Other (non HMO) | Admitting: Family Medicine

## 2020-01-20 ENCOUNTER — Encounter: Payer: Self-pay | Admitting: Family Medicine

## 2020-01-20 DIAGNOSIS — S83002A Unspecified subluxation of left patella, initial encounter: Secondary | ICD-10-CM

## 2020-01-20 NOTE — Assessment & Plan Note (Signed)
100% better at this time. No significant changes in management. Follow-up as needed

## 2021-05-16 ENCOUNTER — Telehealth: Payer: Self-pay | Admitting: Family Medicine

## 2021-05-16 NOTE — Telephone Encounter (Signed)
Patient called stating that he was previously given Reed Breech knee braces and has continued to use them. The elastic and padding had started to wear down. He asked what would be the best way to see about getting it replaced or should he make an appointment to see Dr Tamala Julian to make sure he is still needing to wear them at this point?  Please advise.

## 2021-05-17 NOTE — Telephone Encounter (Signed)
Sent patient MyChart message to schedule

## 2021-07-06 NOTE — Progress Notes (Signed)
?Charlann Boxer D.O. ?[facility] ?[place] ?Phone: [phone] ?Subjective:   ?I, Jacqualin Combes, am serving as a scribe for Dr. Hulan Saas. ? ?This visit occurred during the SARS-CoV-2 public health emergency.  Safety protocols were in place, including screening questions prior to the visit, additional usage of staff PPE, and extensive cleaning of exam room while observing appropriate contact time as indicated for disinfecting solutions.  ? ? ?I'm seeing this patient by the request  of:  Hulan Fess, MD ? ?CC: Bilateral knee pain ? ?PHX:[id number]  ?Paul [name] is a 61 y.o. male coming in with complaint of B knee pain. Last seen for patellar subluxation in October 2021. Patient states that he has been doing well. Running a 10k at end of month.  Patient has been doing well.  Never runs 2 days in a row.  Patient states that he has been doing very well with no significant pain.  Patient does does notice that he does not wear his braces on a regular basis though then starts having some uncomfortable feeling.  Patient continues to be able to run and races and feels he has a good balance at the moment. ?  ? ?Past Medical History:  ?Diagnosis Date  ? Colon cancer [facility])   ? 1992  ? Hypertension   ? Sebaceous cyst   ? ?Past Surgical History:  ?Procedure Laterality Date  ? COLON SURGERY  1992  ? KNEE SURGERY  2000  ? tumor removed    ? ?Social History  ? ?Socioeconomic History  ? Marital status: Married  ?  Spouse name: Not on file  ? Number of children: Not on file  ? Years of education: Not on file  ? Highest education level: Not on file  ?Occupational History  ? Not on file  ?Tobacco Use  ? Smoking status: Never  ? Smokeless tobacco: Never  ?Substance and Sexual Activity  ? Alcohol use: Yes  ?  Alcohol/week: 3.0 standard drinks  ?  Types: 3 Cans of beer per week  ?  Comment: per week  ? Drug use: No  ? Sexual activity: Never  ?Other Topics Concern  ? Not on file  ?Social History  Narrative  ? Not on file  ? ?Social Determinants of Health  ? ?Financial Resource Strain: Not on file  ?Food Insecurity: Not on file  ?Transportation Needs: Not on file  ?Physical Activity: Not on file  ?Stress: Not on file  ?Social Connections: Not on file  ? ?No Known Allergies ?No family history on file. ? ? ?Current Outpatient Medications (Cardiovascular):  ?  cholestyramine (QUESTRAN) 4 GM/DOSE powder, Take 2 g by mouth every morning.  ?  hydrochlorothiazide (MICROZIDE) 12.5 MG capsule, Take 12.5 mg by mouth every morning.  ?  lisinopril (PRINIVIL,ZESTRIL) 20 MG tablet, Take 20 mg by mouth 2 (two) times daily.  ? ? ?Current Outpatient Medications (Analgesics):  ?  aspirin-acetaminophen-caffeine (EXCEDRIN MIGRAINE) 165-537-48 MG per tablet, Take 1 tablet by mouth every 6 (six) hours as needed for pain. ?  meloxicam (MOBIC) 15 MG tablet, Take 1 tablet (15 mg total) by mouth daily. ? ? ?Current Outpatient Medications (Other):  ?  Diclofenac Sodium 2 % SOLN, Place 2 g onto the skin 2 (two) times daily. ?  Diclofenac Sodium 2 % SOLN, Place 2 g onto the skin 2 (two) times daily. ?  gabapentin (NEURONTIN) 100 MG capsule, TAKE 2 CAPSULES (200 MG TOTAL) BY MOUTH AT BEDTIME. ?  Vitamin D, Ergocalciferol, (DRISDOL) 1.25 MG (50000 UT) CAPS capsule, Take 1 capsule (50,000 Units total) by mouth every 7 (seven) days. ? ? ?Review of Systems: ? No headache, visual changes, nausea, vomiting, diarrhea, constipation, dizziness, abdominal pain, skin rash, fevers, chills, night sweats, weight loss, swollen lymph nodes, body aches, joint swelling, chest pain, shortness of breath, mood changes. POSITIVE muscle aches ? ?Objective  ?Blood pressure 128/82, pulse 85, height '5\' 6"'$  (1.676 m), SpO2 98 %. ?  ?General: No apparent distress alert and oriented x3 mood and affect normal, dressed appropriately.  ?HEENT: Pupils equal, extraocular movements intact  ?Respiratory: Patient's speak in full sentences and does not appear short of breath   ?Cardiovascular: No lower extremity edema, non tender, no erythema  ?Gait normal with good balance and coordination.  ?MSK: Patient's knees bilaterally show some very mild lateral tracking noted.  Patient does have though improvement in the ML.  Minimal instability noted with valgus and varus force.  Patient is minorly tender over the medial joint line. ? ?Limited musculoskeletal ultrasound was performed and interpreted Lyndal Pulley  ?Limited ultrasound of patient's knee shows a trace effusion noted of the patellofemoral joint.  Mild narrowing of the patellofemoral joint right greater than left.  Patient also with mild narrowing of the medial joint space.  The pain is significant at the moment otherwise.  He does have some degenerative changes of the medial meniscus on the right side. ?Impression: No significant change from previous ultrasound if anything may be mild improvement ?  ?Impression and Recommendations:  ?  ? ?The above documentation has been reviewed and is accurate and complete Lyndal Pulley, DO ? ? ? ?

## 2021-07-11 ENCOUNTER — Ambulatory Visit: Payer: Managed Care, Other (non HMO) | Admitting: Family Medicine

## 2021-07-11 ENCOUNTER — Ambulatory Visit: Payer: Self-pay

## 2021-07-11 ENCOUNTER — Encounter: Payer: Self-pay | Admitting: Family Medicine

## 2021-07-11 VITALS — BP 128/82 | HR 85 | Ht 66.0 in

## 2021-07-11 DIAGNOSIS — S83002A Unspecified subluxation of left patella, initial encounter: Secondary | ICD-10-CM | POA: Diagnosis not present

## 2021-07-11 DIAGNOSIS — M25562 Pain in left knee: Secondary | ICD-10-CM | POA: Diagnosis not present

## 2021-07-11 DIAGNOSIS — G8929 Other chronic pain: Secondary | ICD-10-CM | POA: Diagnosis not present

## 2021-07-11 DIAGNOSIS — S83241D Other tear of medial meniscus, current injury, right knee, subsequent encounter: Secondary | ICD-10-CM

## 2021-07-11 NOTE — Assessment & Plan Note (Signed)
On ultrasound still has some degenerative changes noted of the meniscus that the patient has seen previously.  This is been going on for greater than 6 years with no improvement no significant instability.  Patient is nontender on exam today.  Patient still seems to be doing better with the Tru pull lite.  Patient will continue to stay active otherwise recommend again see me in as needed.  New Tru pull lite brace given as well because patient feels that that gives him enough stability but keeps him from having to stop running. ?

## 2021-07-11 NOTE — Patient Instructions (Signed)
Good to see you ?See me as needed ? ?

## 2021-07-11 NOTE — Assessment & Plan Note (Signed)
Patient is doing remarkably well at this time.  Patient does need new braces though with patient using his also daily for the nearly 1-1/2 years.  They are no longer giving him the support he needs.  Does give him more stability of the patella while patient is running.  As long as patient does well can follow-up with me as needed. ?

## 2021-07-19 ENCOUNTER — Ambulatory Visit: Payer: Managed Care, Other (non HMO) | Admitting: Family Medicine

## 2022-03-28 NOTE — Progress Notes (Signed)
[facility] Phone: [phone] Subjective:    I'm seeing this patient by the request  of:  Antony Contras, MD  CC: Knee pain  [id number]  07/11/2021 On ultrasound still has some degenerative changes noted of the meniscus that the patient has seen previously.  This is been going on for greater than 6 years with no improvement no significant instability.  Patient is nontender on exam today.  Patient still seems to be doing better with the Tru pull lite.  Patient will continue to stay active otherwise recommend again see me in as needed.  New Tru pull lite brace given as well because patient feels that that gives him enough stability but keeps him from having to stop running.   Patient is doing remarkably well at this time.  Patient does need new braces though with patient using his also daily for the nearly 1-1/2 years.  They are no longer giving him the support he needs.  Does give him more stability of the patella while patient is running.  As long as patient does well can follow-up with me as needed.      Update 03/31/2022 Yostin Malacara is a 62 y.o. male coming in with complaint of B knee pain. Patient states he is getting ready to run a marathon and would like to have the knees checked. Patient also having a twinge in his right shoulder. States was doing some rigorous boot camp over the summer, the twinge has since gotten better but will still have some pain with it when hiking with a hiking pull. Patient locates pain to back shoulder pain above shoulder blade.        Past Medical History:  Diagnosis Date   Colon cancer ([place])    1992   Hypertension    Sebaceous cyst    Past Surgical History:  Procedure Laterality Date   COLON SURGERY  1992   KNEE SURGERY  2000   tumor removed     Social History   Socioeconomic History   Marital status: Married    Spouse name: Not on file   Number of children: Not on file    Years of education: Not on file   Highest education level: Not on file  Occupational History   Not on file  Tobacco Use   Smoking status: Never   Smokeless tobacco: Never  Substance and Sexual Activity   Alcohol use: Yes    Alcohol/week: 3.0 standard drinks of alcohol    Types: 3 Cans of beer per week    Comment: per week   Drug use: No   Sexual activity: Never  Other Topics Concern   Not on file  Social History Narrative   Not on file   Social Determinants of Health   Financial Resource Strain: Not on file  Food Insecurity: Not on file  Transportation Needs: Not on file  Physical Activity: Not on file  Stress: Not on file  Social Connections: Not on file   No Known Allergies No family history on file.   Current Outpatient Medications (Cardiovascular):    cholestyramine (QUESTRAN) 4 GM/DOSE powder, Take 2 g by mouth every morning.    hydrochlorothiazide (MICROZIDE) 12.5 MG capsule, Take 12.5 mg by mouth every morning.    lisinopril (PRINIVIL,ZESTRIL) 20 MG tablet, Take 20 mg by mouth 2 (two) times daily.    Current Outpatient Medications (Analgesics):    aspirin-acetaminophen-caffeine (EXCEDRIN MIGRAINE) 767-209-47 MG per tablet,  Take 1 tablet by mouth every 6 (six) hours as needed for pain.   meloxicam (MOBIC) 15 MG tablet, Take 1 tablet (15 mg total) by mouth daily.   Current Outpatient Medications (Other):    Diclofenac Sodium 2 % SOLN, Place 2 g onto the skin 2 (two) times daily.   Diclofenac Sodium 2 % SOLN, Place 2 g onto the skin 2 (two) times daily.   gabapentin (NEURONTIN) 100 MG capsule, TAKE 2 CAPSULES (200 MG TOTAL) BY MOUTH AT BEDTIME.   Vitamin D, Ergocalciferol, (DRISDOL) 1.25 MG (50000 UT) CAPS capsule, Take 1 capsule (50,000 Units total) by mouth every 7 (seven) days.   Reviewed prior external information including notes and imaging from  primary care provider As well as notes that were available from care everywhere and other healthcare  systems.  Past medical history, social, surgical and family history all reviewed in electronic medical record.  No pertanent information unless stated regarding to the chief complaint.   Review of Systems:  No headache, visual changes, nausea, vomiting, diarrhea, constipation, dizziness, abdominal pain, skin rash, fevers, chills, night sweats, weight loss, swollen lymph nodes, body aches, joint swelling, chest pain, shortness of breath, mood changes. POSITIVE muscle aches  Objective  Blood pressure 120/80, pulse 68, height '5\' 6"'$  (1.676 m), weight 166 lb (75.3 kg), SpO2 99 %.   General: No apparent distress alert and oriented x3 mood and affect normal, dressed appropriately.  HEENT: Pupils equal, extraocular movements intact  Respiratory: Patient's speak in full sentences and does not appear short of breath  Cardiovascular: No lower extremity edema, non tender, no erythema  Bilateral knee exam show the patient is doing relatively well.  Mild lateral tracking of the patella noted on the left knee.  Good stability of the knees noted though.   Limited muscular skeletal ultrasound was performed and interpreted by Hulan Saas, M   Limited ultrasound shows some calcific changes noted on the superior aspect of the patella that is consistent with mild spurring.  Patient has no significant hypoechoic changes within the joint itself.  Medial meniscus of the right knee still has degenerative tearing but no significant displacement. Impression stable knees    Impression and Recommendations:     The above documentation has been reviewed and is accurate and complete Lyndal Pulley, DO

## 2022-03-31 ENCOUNTER — Ambulatory Visit: Payer: Managed Care, Other (non HMO) | Admitting: Family Medicine

## 2022-03-31 ENCOUNTER — Encounter: Payer: Self-pay | Admitting: Family Medicine

## 2022-03-31 ENCOUNTER — Ambulatory Visit: Payer: Self-pay

## 2022-03-31 VITALS — BP 120/80 | HR 68 | Ht 66.0 in | Wt 166.0 lb

## 2022-03-31 DIAGNOSIS — G8929 Other chronic pain: Secondary | ICD-10-CM

## 2022-03-31 DIAGNOSIS — M25519 Pain in unspecified shoulder: Secondary | ICD-10-CM | POA: Insufficient documentation

## 2022-03-31 DIAGNOSIS — S83241D Other tear of medial meniscus, current injury, right knee, subsequent encounter: Secondary | ICD-10-CM

## 2022-03-31 DIAGNOSIS — M25511 Pain in right shoulder: Secondary | ICD-10-CM

## 2022-03-31 DIAGNOSIS — M25561 Pain in right knee: Secondary | ICD-10-CM | POA: Diagnosis not present

## 2022-03-31 DIAGNOSIS — S83002A Unspecified subluxation of left patella, initial encounter: Secondary | ICD-10-CM

## 2022-03-31 DIAGNOSIS — M25562 Pain in left knee: Secondary | ICD-10-CM | POA: Diagnosis not present

## 2022-03-31 NOTE — Patient Instructions (Signed)
Good to see you  Hands with in Periferal vision Knees look good  See me when you need me

## 2022-03-31 NOTE — Assessment & Plan Note (Signed)
Ultrasound shows that he still has a meniscal tear but no significant displacement.  She is doing extremely well with regular activities.  Discussed continuing to monitor.  Patient does have some mild calcific changes noted of the superior patella on the left side that we will continue to monitor.  Could be a potential candidate for shockwave therapy.  Follow-up again in 6 to 8 weeks if needed

## 2022-03-31 NOTE — Assessment & Plan Note (Signed)
Right side stable

## 2022-03-31 NOTE — Assessment & Plan Note (Signed)
Mild calcific changes noted on the ultrasound today.  Discussed with patient about icing regimen and home exercises.

## 2022-05-10 NOTE — Progress Notes (Unsigned)
[name]: [phone] Subjective:   Fontaine No, am serving as a scribe for Dr. Hulan Saas.  I'm seeing this patient by the request  of:  Paul Contras, MD  CC: Left leg pain  [id number]  Paul [name] is a 62 y.o. male coming in with complaint of L leg pain. Last seen in January 2024 for medial meniscus tear R knee. Patient states that 2 weeks ago he was doing a 13 mile run and he developed pain in L groin. Took one week off and got a thigh compression sleeve which helped immediately. Having hard time running with sleeve and Tru-pull lite. Would like bracing advice.   Wants to know if he should use a sleeve on R leg as well. He is training for a marathon.         Past Medical History:  Diagnosis Date   Colon cancer ([place])    1992   Hypertension    Sebaceous cyst    Past Surgical History:  Procedure Laterality Date   COLON SURGERY  1992   KNEE SURGERY  2000   tumor removed     Social History   Socioeconomic History   Marital status: Married    Spouse name: Not on file   Number of children: Not on file   Years of education: Not on file   Highest education level: Not on file  Occupational History   Not on file  Tobacco Use   Smoking status: Never   Smokeless tobacco: Never  Substance and Sexual Activity   Alcohol use: Yes    Alcohol/week: 3.0 standard drinks of alcohol    Types: 3 Cans of beer per week    Comment: per week   Drug use: No   Sexual activity: Never  Other Topics Concern   Not on file  Social History Narrative   Not on file   Social Determinants of Health   Financial Resource Strain: Not on file  Food Insecurity: Not on file  Transportation Needs: Not on file  Physical Activity: Not on file  Stress: Not on file  Social Connections: Not on file   No Known Allergies No family history on file.   Current Outpatient Medications (Cardiovascular):     cholestyramine (QUESTRAN) 4 GM/DOSE powder, Take 2 g by mouth every morning.    hydrochlorothiazide (MICROZIDE) 12.5 MG capsule, Take 12.5 mg by mouth every morning.    lisinopril (PRINIVIL,ZESTRIL) 20 MG tablet, Take 20 mg by mouth 2 (two) times daily.    Current Outpatient Medications (Analgesics):    aspirin-acetaminophen-caffeine (EXCEDRIN MIGRAINE) [id number] MG per tablet, Take 1 tablet by mouth every 6 (six) hours as needed for pain.   meloxicam (MOBIC) 15 MG tablet, Take 1 tablet (15 mg total) by mouth daily.   Current Outpatient Medications (Other):    Diclofenac Sodium 2 % SOLN, Place 2 g onto the skin 2 (two) times daily.   Diclofenac Sodium 2 % SOLN, Place 2 g onto the skin 2 (two) times daily.   gabapentin (NEURONTIN) 100 MG capsule, TAKE 2 CAPSULES (200 MG TOTAL) BY MOUTH AT BEDTIME.   Vitamin D, Ergocalciferol, (DRISDOL) 1.25 MG (50000 UT) CAPS capsule, Take 1 capsule (50,000 Units total) by mouth every 7 (seven) days.    Objective  Blood pressure 122/86, pulse 73, height 5' 6"$  (1.676 m), weight 169 lb (76.7 kg), SpO2 99 %.   General: No apparent distress alert and  oriented x3 mood and affect normal, dressed appropriately.  HEENT: Pupils equal, extraocular movements intact  Respiratory: Patient's speak in full sentences and does not appear short of breath  Cardiovascular: No lower extremity edema, non tender, no erythema  Left leg exam shows the patient does have some limited internal range of motion with only 5 degrees of the left hip.  Bilateral knees do not have any significant swelling but does have mild crepitus noted.  Patient does have limited FABER test bilaterally.    Impression and Recommendations:     The above documentation has been reviewed and is accurate and complete Paul Pulley, DO

## 2022-05-11 ENCOUNTER — Ambulatory Visit: Payer: Self-pay

## 2022-05-11 ENCOUNTER — Ambulatory Visit (INDEPENDENT_AMBULATORY_CARE_PROVIDER_SITE_OTHER): Payer: Managed Care, Other (non HMO)

## 2022-05-11 ENCOUNTER — Ambulatory Visit: Payer: Managed Care, Other (non HMO) | Admitting: Family Medicine

## 2022-05-11 VITALS — BP 122/86 | HR 73 | Ht 66.0 in | Wt 169.0 lb

## 2022-05-11 DIAGNOSIS — M25562 Pain in left knee: Secondary | ICD-10-CM | POA: Diagnosis not present

## 2022-05-11 DIAGNOSIS — M25552 Pain in left hip: Secondary | ICD-10-CM

## 2022-05-11 DIAGNOSIS — M1711 Unilateral primary osteoarthritis, right knee: Secondary | ICD-10-CM | POA: Diagnosis not present

## 2022-05-11 NOTE — Assessment & Plan Note (Signed)
Concern with the limited range of motion that there is some underlying arthritis that is contributing.  Discussed icing regimen and home exercises gust which activities to do and which ones to avoid, increase activity slowly.  Follow-up again in 6 to 8 weeks

## 2022-05-11 NOTE — Assessment & Plan Note (Signed)
Stable at the moment.  No signal again difficulties, discussed icing regimen and home exercises, continue with the Tru pull lite bracing.  Discussed with patient about icing regimen and home exercises, follow-up again in 6 to 8 weeks otherwise.  Will be doing a marathon in the interim.

## 2022-05-11 NOTE — Patient Instructions (Addendum)
Xray today Body Helix or a short sleeve Hip exercises Kick butt in marathon See me 8 weeks

## 2022-07-10 NOTE — Progress Notes (Unsigned)
[name] [address] [place] [address] Phone: [phone] Subjective:   [name], am serving as a scribe for Dr. Antoine Primas.  I'm seeing this patient by the request  of:  Tally Joe, MD  CC: knee aqnd back pain   [id number]  05/11/2022 Concern with the limited range of motion that there is some underlying arthritis that is contributing.  Discussed icing regimen and home exercises gust which activities to do and which ones to avoid, increase activity slowly.  Follow-up again in 6 to 8 weeks     Stable at the moment. No signal again difficulties, discussed icing regimen and home exercises, continue with the Tru pull lite bracing. Discussed with patient about icing regimen and home exercises, follow-up again in 6 to 8 weeks otherwise. Will be doing a marathon in the interim.   Update 07/11/2022 Kaevion Sinclair is a 62 y.o. male coming in with complaint of R knee and L hip pain.  Mild hamstring injury. Was to do compression, HEP but continue to run. Patient states post marathon check in. Would like you to look at right shoulder. Knees are doing good. Compression sleeve and knee braces working.       Past Medical History:  Diagnosis Date   Colon cancer ([facility])    1992   Hypertension    Sebaceous cyst    Past Surgical History:  Procedure Laterality Date   COLON SURGERY  1992   KNEE SURGERY  2000   tumor removed     Social History   Socioeconomic History   Marital status: Married    Spouse name: Not on file   Number of children: Not on file   Years of education: Not on file   Highest education level: Not on file  Occupational History   Not on file  Tobacco Use   Smoking status: Never   Smokeless tobacco: Never  Substance and Sexual Activity   Alcohol use: Yes    Alcohol/week: 3.0 standard drinks of alcohol    Types: 3 Cans of beer per week    Comment: per week   Drug use: No   Sexual activity: Never  Other Topics  Concern   Not on file  Social History Narrative   Not on file   Social Determinants of Health   Financial Resource Strain: Not on file  Food Insecurity: Not on file  Transportation Needs: Not on file  Physical Activity: Not on file  Stress: Not on file  Social Connections: Not on file   No Known Allergies No family history on file.   Current Outpatient Medications (Cardiovascular):    cholestyramine (QUESTRAN) 4 GM/DOSE powder, Take 2 g by mouth every morning.    hydrochlorothiazide (MICROZIDE) 12.5 MG capsule, Take 12.5 mg by mouth every morning.    lisinopril (PRINIVIL,ZESTRIL) 20 MG tablet, Take 20 mg by mouth 2 (two) times daily.    Current Outpatient Medications (Analgesics):    aspirin-acetaminophen-caffeine (EXCEDRIN MIGRAINE) 250-250-65 MG per tablet, Take 1 tablet by mouth every 6 (six) hours as needed for pain.   meloxicam (MOBIC) 15 MG tablet, Take 1 tablet (15 mg total) by mouth daily.   Current Outpatient Medications (Other):    Diclofenac Sodium 2 % SOLN, Place 2 g onto the skin 2 (two) times daily.   Diclofenac Sodium 2 % SOLN, Place 2 g onto the skin 2 (two) times daily.   gabapentin (NEURONTIN) 100 MG capsule, TAKE 2 CAPSULES (200 MG  TOTAL) BY MOUTH AT BEDTIME.   Vitamin D, Ergocalciferol, (DRISDOL) 1.25 MG (50000 UT) CAPS capsule, Take 1 capsule (50,000 Units total) by mouth every 7 (seven) days.   Reviewed prior external information including notes and imaging from  primary care provider As well as notes that were available from care everywhere and other healthcare systems.  Past medical history, social, surgical and family history all reviewed in electronic medical record.  No pertanent information unless stated regarding to the chief complaint.   Review of Systems:  No headache, visual changes, nausea, vomiting, diarrhea, constipation, dizziness, abdominal pain, skin rash, fevers, chills, night sweats, weight loss, swollen lymph nodes, body aches, joint  swelling, chest pain, shortness of breath, mood changes. POSITIVE muscle aches  Objective  Blood pressure 116/74, pulse 73, height 5\' 6"  (1.676 m), weight 171 lb (77.6 kg), SpO2 98 %.   General: No apparent distress alert and oriented x3 mood and affect normal, dressed appropriately.  HEENT: Pupils equal, extraocular movements intact  Respiratory: Patient's speak in full sentences and does not appear short of breath  Cardiovascular: No lower extremity edema, non tender, no erythema  Gait normal Right shoulder exam does have positive crossover sign noted.  Some mild tenderness to 5 out of 5 in negative O'Brien's  Limited muscular skeletal ultrasound was performed and interpreted by Antoine Primas, M  Limited ultrasound of patient's shoulder shows rotator cuff there is unremarkable with no significant tearing aspect.  Bicep tendon is unremarkable as well.  Glenohumeral joint has no significant arthritic changes.  Patient does have spurring of the right acromioclavicular joint.  Patient does have hypoechoic changes showing an effusion as well. Impression: AC arthritis with spurring and effusion.  [id number]; 15 additional minutes spent for Therapeutic exercises as stated in above notes.  This included exercises focusing on stretching, strengthening, with significant focus on eccentric aspects.   Long term goals include an improvement in range of motion, strength, endurance as well as avoiding reinjury. Patient's frequency would include in 1-2 times a day, 3-5 times a week for a duration of 6-12 weeks.  Shoulder Exercises that included:  Basic scapular stabilization to include adduction and depression of scapula Scaption, focusing on proper movement and good control Internal and External rotation utilizing a theraband, with elbow tucked at side entire time Rows with theraband  Proper technique shown and discussed handout in great detail with ATC.  All questions were discussed and answered.       Impression and Recommendations:    The above documentation has been reviewed and is accurate and complete Judi Saa, DO

## 2022-07-11 ENCOUNTER — Encounter: Payer: Self-pay | Admitting: Family Medicine

## 2022-07-11 ENCOUNTER — Ambulatory Visit: Payer: Managed Care, Other (non HMO) | Admitting: Family Medicine

## 2022-07-11 ENCOUNTER — Ambulatory Visit: Payer: Self-pay

## 2022-07-11 VITALS — BP 116/74 | HR 73 | Ht 66.0 in | Wt 171.0 lb

## 2022-07-11 DIAGNOSIS — M25461 Effusion, right knee: Secondary | ICD-10-CM | POA: Diagnosis not present

## 2022-07-11 DIAGNOSIS — M25511 Pain in right shoulder: Secondary | ICD-10-CM

## 2022-07-11 NOTE — Assessment & Plan Note (Signed)
Patient does have an exacerbation noted.  Given some exercises for more of the shoulder at this time.  Discussed topical anti-inflammatories and icing regimen.  Discussed topical anti-inflammatories.  One of the best thing is acceptable at this time.  Follow-up with me again 6 weeks.  Worsening symptoms consider injection.

## 2022-07-11 NOTE — Patient Instructions (Signed)
Do prescribed exercises at least 3x a week Arnicare and icing Keep hands within peripheral vision See you again in 6 weeks

## 2022-08-17 NOTE — Progress Notes (Signed)
[name] [address] [place] [address] Phone: [phone] Subjective:   Paul [name], am serving as a scribe for Dr. Antoine Primas.  I'm seeing this patient by the request  of:  Tally Joe, MD  CC: right knee and right shoulder pain   [id number]  07/11/2022 Patient does have an exacerbation noted.  Given some exercises for more of the shoulder at this time.  Discussed topical anti-inflammatories and icing regimen.  Discussed topical anti-inflammatories.  One of the best thing is acceptable at this time.  Follow-up with me again 6 weeks.  Worsening symptoms consider injection.     Update 08/22/2022 Paul [name] is a 62 y.o. male coming in with complaint of R knee and R AC joint pain. Patient states that he is using Arnica on his shoulder. Has been altering pushup count.   Using sleeve on quad and using braces on both knees.       Past Medical History:  Diagnosis Date   Colon cancer ([facility])    1992   Hypertension    Sebaceous cyst    Past Surgical History:  Procedure Laterality Date   COLON SURGERY  1992   KNEE SURGERY  2000   tumor removed     Social History   Socioeconomic History   Marital status: Married    Spouse name: Not on file   Number of children: Not on file   Years of education: Not on file   Highest education level: Not on file  Occupational History   Not on file  Tobacco Use   Smoking status: Never   Smokeless tobacco: Never  Substance and Sexual Activity   Alcohol use: Yes    Alcohol/week: 3.0 standard drinks of alcohol    Types: 3 Cans of beer per week    Comment: per week   Drug use: No   Sexual activity: Never  Other Topics Concern   Not on file  Social History Narrative   Not on file   Social Determinants of Health   Financial Resource Strain: Not on file  Food Insecurity: Not on file  Transportation Needs: Not on file  Physical Activity: Not on file  Stress: Not on file  Social  Connections: Not on file   No Known Allergies No family history on file.   Current Outpatient Medications (Cardiovascular):    cholestyramine (QUESTRAN) 4 GM/DOSE powder, Take 2 g by mouth every morning.    hydrochlorothiazide (MICROZIDE) 12.5 MG capsule, Take 12.5 mg by mouth every morning.    lisinopril (PRINIVIL,ZESTRIL) 20 MG tablet, Take 20 mg by mouth 2 (two) times daily.    Current Outpatient Medications (Analgesics):    aspirin-acetaminophen-caffeine (EXCEDRIN MIGRAINE) 250-250-65 MG per tablet, Take 1 tablet by mouth every 6 (six) hours as needed for pain.   meloxicam (MOBIC) 15 MG tablet, Take 1 tablet (15 mg total) by mouth daily.   Current Outpatient Medications (Other):    Diclofenac Sodium 2 % SOLN, Place 2 g onto the skin 2 (two) times daily.   Diclofenac Sodium 2 % SOLN, Place 2 g onto the skin 2 (two) times daily.   gabapentin (NEURONTIN) 100 MG capsule, TAKE 2 CAPSULES (200 MG TOTAL) BY MOUTH AT BEDTIME.   Vitamin D, Ergocalciferol, (DRISDOL) 1.25 MG (50000 UT) CAPS capsule, Take 1 capsule (50,000 Units total) by mouth every 7 (seven) days.   Objective  Blood pressure 128/80, pulse 67, height 5\' 6"  (1.676 m), weight 175 lb (79.4  kg), SpO2 97 %.   General: No apparent distress alert and oriented x3 mood and affect normal, dressed appropriately.  HEENT: Pupils equal, extraocular movements intact  Respiratory: Patient's speak in full sentences and does not appear short of breath  Cardiovascular: No lower extremity edema, non tender, no erythema  Right shoulder exam shows good range of motion noted.  Still has some very mild crossover noted.  Right knee exam shows very mild crepitus noted.  Mild tenderness over the medial joint line    Impression and Recommendations:    The above documentation has been reviewed and is accurate and complete Judi Saa, DO

## 2022-08-22 ENCOUNTER — Other Ambulatory Visit: Payer: Self-pay

## 2022-08-22 ENCOUNTER — Encounter: Payer: Self-pay | Admitting: Family Medicine

## 2022-08-22 ENCOUNTER — Ambulatory Visit: Payer: Managed Care, Other (non HMO) | Admitting: Family Medicine

## 2022-08-22 VITALS — BP 128/80 | HR 67 | Ht 66.0 in | Wt 175.0 lb

## 2022-08-22 DIAGNOSIS — M25511 Pain in right shoulder: Secondary | ICD-10-CM

## 2022-08-22 NOTE — Patient Instructions (Signed)
Great to see you! See me in 2-3 months. 

## 2022-08-22 NOTE — Assessment & Plan Note (Signed)
Patient is doing well overall.  Discussed icing regimen and home exercises.  Discussed worsening pain can consider injections.  Follow-up with me again in 2 to 3 months.  Could be candidate for osteopathic manipulation.

## 2022-11-03 NOTE — Progress Notes (Unsigned)
[name] [address] [place] [address] Phone: [phone] Subjective:   Paul [name], am serving as a scribe for Dr. Antoine Primas.  I'm seeing this patient by the request  of:  Tally Joe, MD  CC: Hip pain, back pain, neck pain  BJY:[id number]  08/22/2022 Patient is doing well overall. Discussed icing regimen and home exercises. Discussed worsening pain can consider injections. Follow-up with me again in 2 to 3 months. Could be candidate for osteopathic manipulation.   Update 11/06/2022 Paul [name] is a 62 y.o. male coming in with complaint of R shoulder pain. Patient states starting half marathon training and starting to get tension in R groin area. Was wondering if he needed a sleeve. Feeling discomfort after run feeling tight and sore. Not preventing him from doing activity. Shoulder is doing okay.       Past Medical History:  Diagnosis Date   Colon cancer (HCC)    1992   Hypertension    Sebaceous cyst    Past Surgical History:  Procedure Laterality Date   COLON SURGERY  1992   KNEE SURGERY  2000   tumor removed     Social History   Socioeconomic History   Marital status: Married    Spouse name: Not on file   Number of children: Not on file   Years of education: Not on file   Highest education level: Not on file  Occupational History   Not on file  Tobacco Use   Smoking status: Never   Smokeless tobacco: Never  Substance and Sexual Activity   Alcohol use: Yes    Alcohol/week: 3.0 standard drinks of alcohol    Types: 3 Cans of beer per week    Comment: per week   Drug use: No   Sexual activity: Never  Other Topics Concern   Not on file  Social History Narrative   Not on file   Social Determinants of Health   Financial Resource Strain: Not on file  Food Insecurity: Not on file  Transportation Needs: Not on file  Physical Activity: Not on file  Stress: Not on file  Social Connections: Unknown (07/07/2022)    Received from [facility], [facility]   Social Network    Social Network: Not on file   No Known Allergies No family history on file.   Current Outpatient Medications (Cardiovascular):    cholestyramine (QUESTRAN) 4 GM/DOSE powder, Take 2 g by mouth every morning.    hydrochlorothiazide (MICROZIDE) 12.5 MG capsule, Take 12.5 mg by mouth every morning.    lisinopril (PRINIVIL,ZESTRIL) 20 MG tablet, Take 20 mg by mouth 2 (two) times daily.    Current Outpatient Medications (Analgesics):    aspirin-acetaminophen-caffeine (EXCEDRIN MIGRAINE) 250-250-65 MG per tablet, Take 1 tablet by mouth every 6 (six) hours as needed for pain.   meloxicam (MOBIC) 15 MG tablet, Take 1 tablet (15 mg total) by mouth daily.   Current Outpatient Medications (Other):    tiZANidine (ZANAFLEX) 2 MG tablet, Take 1 tablet (2 mg total) by mouth at bedtime.   Diclofenac Sodium 2 % SOLN, Place 2 g onto the skin 2 (two) times daily.   Diclofenac Sodium 2 % SOLN, Place 2 g onto the skin 2 (two) times daily.   gabapentin (NEURONTIN) 100 MG capsule, TAKE 2 CAPSULES (200 MG TOTAL) BY MOUTH AT BEDTIME.   Vitamin D, Ergocalciferol, (DRISDOL) 1.25 MG (50000 UT) CAPS capsule, Take 1 capsule (50,000 Units total) by mouth  every 7 (seven) days.   Reviewed prior external information including notes and imaging from  primary care provider As well as notes that were available from care everywhere and other healthcare systems.  Past medical history, social, surgical and family history all reviewed in electronic medical record.  No pertanent information unless stated regarding to the chief complaint.   Review of Systems:  No headache, visual changes, nausea, vomiting, diarrhea, constipation, dizziness, abdominal pain, skin rash, fevers, chills, night sweats, weight loss, swollen lymph nodes, body aches, joint swelling, chest pain, shortness of breath, mood changes. POSITIVE muscle aches  Objective  Blood pressure  120/78, pulse 72, height 5\' 6"  (1.676 m), weight 174 lb (78.9 kg), SpO2 98%.   General: No apparent distress alert and oriented x3 mood and affect normal, dressed appropriately.  HEENT: Pupils equal, extraocular movements intact  Respiratory: Patient's speak in full sentences and does not appear short of breath  Cardiovascular: No lower extremity edema, non tender, no erythema  Neck exam does have some loss lordosis noted.  Patient does have tightness noted in the right hip.  Patient does have tightness of the hip flexor on the right greater than left.  No tenderness over the greater trochanteric area.  Osteopathic findings  C2 flexed rotated and side bent right C5 flexed rotated and side bent right T3 extended rotated and side bent right inhaled third rib T7 extended rotated and side bent right  L2 flexed rotated and side bent right Sacrum right on right     Impression and Recommendations:     Snapping hip syndrome, right Recurrent snapping hip syndrome.  Discussed icing regimen and home exercises, discussed which activities to do and which ones to avoid.  Increase activity slowly over the course of next several weeks.  Follow-up with me again 6 to 8 weeks.  Patient is traveling and going to also be doing 3 half marathons in the near future.  Neck pain More tightness than what is appreciated previously.  Discussed with patient icing regimen.  Increase activity slowly over the course of next several weeks.  Given some scapular exercises that I think will be helpful.  Responded well to osteopathic manipulation.  Follow-up again in 6 to 8 weeks otherwise.    The above documentation has been reviewed and is accurate and complete Judi Saa, DO

## 2022-11-06 ENCOUNTER — Encounter: Payer: Self-pay | Admitting: Family Medicine

## 2022-11-06 ENCOUNTER — Ambulatory Visit: Payer: Managed Care, Other (non HMO) | Admitting: Family Medicine

## 2022-11-06 VITALS — BP 120/78 | HR 72 | Ht 66.0 in | Wt 174.0 lb

## 2022-11-06 DIAGNOSIS — M9902 Segmental and somatic dysfunction of thoracic region: Secondary | ICD-10-CM

## 2022-11-06 DIAGNOSIS — M9901 Segmental and somatic dysfunction of cervical region: Secondary | ICD-10-CM | POA: Diagnosis not present

## 2022-11-06 DIAGNOSIS — M542 Cervicalgia: Secondary | ICD-10-CM | POA: Diagnosis not present

## 2022-11-06 DIAGNOSIS — M24851 Other specific joint derangements of right hip, not elsewhere classified: Secondary | ICD-10-CM | POA: Diagnosis not present

## 2022-11-06 DIAGNOSIS — M9904 Segmental and somatic dysfunction of sacral region: Secondary | ICD-10-CM | POA: Diagnosis not present

## 2022-11-06 DIAGNOSIS — M9908 Segmental and somatic dysfunction of rib cage: Secondary | ICD-10-CM

## 2022-11-06 DIAGNOSIS — M9903 Segmental and somatic dysfunction of lumbar region: Secondary | ICD-10-CM | POA: Diagnosis not present

## 2022-11-06 MED ORDER — TIZANIDINE HCL 2 MG PO TABS: 2.0000 mg | ORAL_TABLET | Freq: Every day | ORAL | 0 refills | Status: DC

## 2022-11-06 NOTE — Assessment & Plan Note (Signed)
Recurrent snapping hip syndrome.  Discussed icing regimen and home exercises, discussed which activities to do and which ones to avoid.  Increase activity slowly over the course of next several weeks.  Follow-up with me again 6 to 8 weeks.  Patient is traveling and going to also be doing 3 half marathons in the near future.

## 2022-11-06 NOTE — Patient Instructions (Addendum)
Do prescribed exercises at least 3x a week Wear compression sleeve Manipulation today Zanaflex 2mg  when needed prescribed See you again in 6 weeks

## 2022-11-06 NOTE — Assessment & Plan Note (Signed)
More tightness than what is appreciated previously.  Discussed with patient icing regimen.  Increase activity slowly over the course of next several weeks.  Given some scapular exercises that I think will be helpful.  Responded well to osteopathic manipulation.  Follow-up again in 6 to 8 weeks otherwise.

## 2022-11-28 ENCOUNTER — Other Ambulatory Visit: Payer: Self-pay | Admitting: Family Medicine

## 2022-12-14 NOTE — Progress Notes (Signed)
[name] [address] [place] [address] Phone: [phone] Subjective:   Paul [name], am serving as a scribe for Dr. Antoine Primas.  I'm seeing this patient by the request  of:  Tally Joe, MD  CC: Back and neck pain follow-up  [id number]  Paul [name] is a 62 y.o. male coming in with complaint of back and neck pain. OMT 11/06/2202. Patient states that he ran 13 miles on Saturday. Wants new knee braces.   R shoulder pain worse when hiking with his pole. Pain increases with push ups. Pain over deltoid muscle.   Working on improving cervical spine ROM. Trying to do exercises.   Medications patient has been prescribed: Zanaflex  Taking: Yes         Reviewed prior external information including notes and imaging from previsou exam, outside providers and external EMR if available.   As well as notes that were available from care everywhere and other healthcare systems.  Past medical history, social, surgical and family history all reviewed in electronic medical record.  No pertanent information unless stated regarding to the chief complaint.   Past Medical History:  Diagnosis Date   Colon cancer (HCC)    1992   Hypertension    Sebaceous cyst     No Known Allergies   Review of Systems:  No headache, visual changes, nausea, vomiting, diarrhea, constipation, dizziness, abdominal pain, skin rash, fevers, chills, night sweats, weight loss, swollen lymph nodes, body aches, joint swelling, chest pain, shortness of breath, mood changes. POSITIVE muscle aches  Objective  Blood pressure 130/88, pulse 68, height 5\' 6"  (1.676 m), weight 174 lb (78.9 kg), SpO2 98%.   General: No apparent distress alert and oriented x3 mood and affect normal, dressed appropriately.  HEENT: Pupils equal, extraocular movements intact  Respiratory: Patient's speak in full sentences and does not appear short of breath  Cardiovascular: No lower extremity  edema, non tender, no erythema  Right shoulder has good range of motion but still some mild swelling over the acromioclavicular joint on the right side.  Osteopathic findings  C3 flexed rotated and side bent right C6 flexed rotated and side bent left T3 extended rotated and side bent right inhaled rib L1 flexed rotated and side bent right Sacrum right on right       Assessment and Plan:  AC joint pain Mild exacerbation after a long hike.  Nothing though that has been severe enough to stop him from activities.  Follow-up with me again in 6 to 8 weeks otherwise.  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  Seems to be more of the patellofemoral joint.  Given new VMO strengthening exercises as well as Tru pull lite braces that I think will be helpful.  Discussed which activities to do and which ones to avoid.  Patient is an avid runner and will continue to do so.  Follow-up with me again in 6 to 8 weeks.  Neck pain Chronic problem but seems to be relatively stable overall.  Discussed with patient again to about osteopathic manipulation which patient does respond well to.  We discussed with patient to continue to do more the stretching and ergonomics.  Patient is able to stay very active.  Patient will follow-up with me again in 6 to 8 weeks otherwise.    Nonallopathic problems  Decision today to treat with OMT was based on Physical Exam  After verbal consent patient was treated  with HVLA, ME, FPR techniques in cervical, rib, thoracic, lumbar, and sacral  areas  Patient tolerated the procedure well with improvement in symptoms  Patient given exercises, stretches and lifestyle modifications  See medications in patient instructions if given  Patient will follow up in 4-8 weeks     The above documentation has been reviewed and is accurate and complete Judi Saa, DO         Note: This dictation was prepared with Dragon dictation along with  smaller phrase technology. Any transcriptional errors that result from this process are unintentional.

## 2022-12-18 ENCOUNTER — Ambulatory Visit: Payer: Managed Care, Other (non HMO) | Admitting: Family Medicine

## 2022-12-18 ENCOUNTER — Ambulatory Visit: Payer: Self-pay

## 2022-12-18 ENCOUNTER — Encounter: Payer: Self-pay | Admitting: Family Medicine

## 2022-12-18 VITALS — BP 130/88 | HR 68 | Ht 66.0 in | Wt 174.0 lb

## 2022-12-18 DIAGNOSIS — M9902 Segmental and somatic dysfunction of thoracic region: Secondary | ICD-10-CM

## 2022-12-18 DIAGNOSIS — M9901 Segmental and somatic dysfunction of cervical region: Secondary | ICD-10-CM

## 2022-12-18 DIAGNOSIS — M9903 Segmental and somatic dysfunction of lumbar region: Secondary | ICD-10-CM | POA: Diagnosis not present

## 2022-12-18 DIAGNOSIS — M9904 Segmental and somatic dysfunction of sacral region: Secondary | ICD-10-CM | POA: Diagnosis not present

## 2022-12-18 DIAGNOSIS — M9908 Segmental and somatic dysfunction of rib cage: Secondary | ICD-10-CM

## 2022-12-18 DIAGNOSIS — M542 Cervicalgia: Secondary | ICD-10-CM

## 2022-12-18 DIAGNOSIS — M1711 Unilateral primary osteoarthritis, right knee: Secondary | ICD-10-CM

## 2022-12-18 DIAGNOSIS — M25511 Pain in right shoulder: Secondary | ICD-10-CM | POA: Diagnosis not present

## 2022-12-18 NOTE — Assessment & Plan Note (Signed)
Chronic problem but seems to be relatively stable overall.  Discussed with patient again to about osteopathic manipulation which patient does respond well to.  We discussed with patient to continue to do more the stretching and ergonomics.  Patient is able to stay very active.  Patient will follow-up with me again in 6 to 8 weeks otherwise.

## 2022-12-18 NOTE — Assessment & Plan Note (Signed)
Mild exacerbation after a long hike.  Nothing though that has been severe enough to stop him from activities.  Follow-up with me again in 6 to 8 weeks otherwise.

## 2022-12-18 NOTE — Assessment & Plan Note (Signed)
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  Seems to be more of the patellofemoral joint.  Given new VMO strengthening exercises as well as Tru pull lite braces that I think will be helpful.  Discussed which activities to do and which ones to avoid.  Patient is an avid runner and will continue to do so.  Follow-up with me again in 6 to 8 weeks.

## 2022-12-18 NOTE — Patient Instructions (Signed)
See me in 2 months

## 2023-02-08 NOTE — Progress Notes (Signed)
[name] [address] [place] [address] Phone: [phone] Subjective:   Paul [name], am serving as a scribe for Dr. Antoine Primas.  I'm seeing this patient by the request  of:  Tally Joe, MD  CC: back and neck pain   [id number]  Paul [name] is a 62 y.o. male coming in with complaint of back and neck pain. OMT 12/18/2022. Also f/u for R shoulder and R knee pain. Patient states that he ran a 1/2 marathon yesterday and developed pain in the R quad with 7 miles to go. He had to stop and stretch quad and could run 1/2 a mile before he had to stop and stretch again. No pain today in quad.   Had not had any R shoulder pain with running.   No back pain today.   Medications patient has been prescribed: None       Reviewed prior external information including notes and imaging from previsou exam, outside providers and external EMR if available.   As well as notes that were available from care everywhere and other healthcare systems.  Past medical history, social, surgical and family history all reviewed in electronic medical record.  No pertanent information unless stated regarding to the chief complaint.   Past Medical History:  Diagnosis Date   Colon cancer (HCC)    1992   Hypertension    Sebaceous cyst     No Known Allergies   Review of Systems:  No headache, visual changes, nausea, vomiting, diarrhea, constipation, dizziness, abdominal pain, skin rash, fevers, chills, night sweats, weight loss, swollen lymph nodes, body aches, joint swelling, chest pain, shortness of breath, mood changes. POSITIVE muscle aches  Objective  Blood pressure 122/88, pulse 68, height 5\' 6"  (1.676 m), weight 175 lb (79.4 kg), SpO2 98%.   General: No apparent distress alert and oriented x3 mood and affect normal, dressed appropriately.  HEENT: Pupils equal, extraocular movements intact  Respiratory: Patient's speak in full sentences and does not  appear short of breath  Cardiovascular: No lower extremity edema, non tender, no erythema  Patient has had difficulty with snapping hip previously seems on exam today though no significant pain over the quadricep.  Good strength of the hip flexor noted.  Osteopathic findings  C2 flexed rotated and side bent right C5 flexed rotated and side bent left T3 extended rotated and side bent right inhaled rib T6 extended rotated and side bent left L2 flexed rotated and side bent right Sacrum right on right    Assessment and Plan:  Snapping hip syndrome, right Today because may be a potential exacerbation of the snapping hip or maybe patient did have some spasming in the quadricep itself.  Do not feel that there is any significant radicular symptoms and patient is nearly back to his baseline already at this point.  Discussed icing regimen and home exercises, which activities to do and which ones to avoid.  Increase activity slowly over the course of next several weeks.  Follow-up again in 6 to 8 weeks    Nonallopathic problems  Decision today to treat with OMT was based on Physical Exam  After verbal consent patient was treated with HVLA, ME, FPR techniques in cervical, rib, thoracic, lumbar, and sacral  areas  Patient tolerated the procedure well with improvement in symptoms  Patient given exercises, stretches and lifestyle modifications  See medications in patient instructions if given  Patient will follow up in 4-8 weeks  The above documentation has been reviewed and is accurate and complete Judi Saa, DO         Note: This dictation was prepared with Dragon dictation along with smaller phrase technology. Any transcriptional errors that result from this process are unintentional.

## 2023-02-12 ENCOUNTER — Encounter: Payer: Self-pay | Admitting: Family Medicine

## 2023-02-12 ENCOUNTER — Other Ambulatory Visit: Payer: Self-pay

## 2023-02-12 ENCOUNTER — Ambulatory Visit: Payer: Managed Care, Other (non HMO) | Admitting: Family Medicine

## 2023-02-12 VITALS — BP 122/88 | HR 68 | Ht 66.0 in | Wt 175.0 lb

## 2023-02-12 DIAGNOSIS — M9901 Segmental and somatic dysfunction of cervical region: Secondary | ICD-10-CM

## 2023-02-12 DIAGNOSIS — M9902 Segmental and somatic dysfunction of thoracic region: Secondary | ICD-10-CM | POA: Diagnosis not present

## 2023-02-12 DIAGNOSIS — M24851 Other specific joint derangements of right hip, not elsewhere classified: Secondary | ICD-10-CM

## 2023-02-12 DIAGNOSIS — M25511 Pain in right shoulder: Secondary | ICD-10-CM

## 2023-02-12 DIAGNOSIS — M9903 Segmental and somatic dysfunction of lumbar region: Secondary | ICD-10-CM

## 2023-02-12 DIAGNOSIS — M9908 Segmental and somatic dysfunction of rib cage: Secondary | ICD-10-CM

## 2023-02-12 DIAGNOSIS — M9904 Segmental and somatic dysfunction of sacral region: Secondary | ICD-10-CM

## 2023-02-12 NOTE — Patient Instructions (Signed)
Body Helix compression sleeve See me again in 2-3 months

## 2023-02-12 NOTE — Assessment & Plan Note (Signed)
Today because may be a potential exacerbation of the snapping hip or maybe patient did have some spasming in the quadricep itself.  Do not feel that there is any significant radicular symptoms and patient is nearly back to his baseline already at this point.  Discussed icing regimen and home exercises, which activities to do and which ones to avoid.  Increase activity slowly over the course of next several weeks.  Follow-up again in 6 to 8 weeks

## 2023-04-06 NOTE — Progress Notes (Signed)
  [name] [address] [place] [address] Phone: [phone] Subjective:   LILLETTE Berwyn Posey, am serving as a scribe for Dr. Arthea Claudene.  I'm seeing this patient by the request  of:  Seabron Lenis, MD  CC: Back and neck pain follow-up  YEP:[id number]  Dimitris Shanahan is a 63 y.o. male coming in with complaint of back and neck pain. OMT 02/12/2024. Also f/u for R shoulder and R hip pain.  Patient states that he is doing well. Less pain in all areas.   Medications patient has been prescribed: None  Taking:         Reviewed prior external information including notes and imaging from previsou exam, outside providers and external EMR if available.   As well as notes that were available from care everywhere and other healthcare systems.  Past medical history, social, surgical and family history all reviewed in electronic medical record.  No pertanent information unless stated regarding to the chief complaint.   Past Medical History:  Diagnosis Date   Colon cancer (HCC)    1992   Hypertension    Sebaceous cyst     No Known Allergies   Review of Systems:  No headache, visual changes, nausea, vomiting, diarrhea, constipation, dizziness, abdominal pain, skin rash, fevers, chills, night sweats, weight loss, swollen lymph nodes, body aches, joint swelling, chest pain, shortness of breath, mood changes. POSITIVE muscle aches  Objective  Blood pressure 110/84, pulse (!) 53, height 5' 6 (1.676 m), weight 180 lb (81.6 kg), SpO2 99%.   General: No apparent distress alert and oriented x3 mood and affect normal, dressed appropriately.  HEENT: Pupils equal, extraocular movements intact  Respiratory: Patient's speak in full sentences and does not appear short of breath  Cardiovascular: No lower extremity edema, non tender, no erythema  MSK:  Back does have loss of lordosis tightness on the right side   Osteopathic findings  C2 flexed rotated and  side bent right C7 flexed rotated and side bent left T3 extended rotated and side bent right inhaled rib T9 extended rotated and side bent left L2 flexed rotated and side bent right Sacrum right on right     Assessment and Plan:  Snapping hip syndrome, right Mild tightness noted.  Nothing severe overall though.  Discussed icing regimen and home exercises.  Patient has been doing very well.  Is crosstraining at the moment.  Increase activity slowly otherwise.  Follow-up again in 8-12 weeks.    Nonallopathic problems  Decision today to treat with OMT was based on Physical Exam  After verbal consent patient was treated with HVLA, ME, FPR techniques in cervical, rib, thoracic, lumbar, and sacral  areas  Patient tolerated the procedure well with improvement in symptoms  Patient given exercises, stretches and lifestyle modifications  See medications in patient instructions if given  Patient will follow up in 8-12 weeks    The above documentation has been reviewed and is accurate and complete Noam Franzen M Caileb Rhue, DO          Note: This dictation was prepared with Dragon dictation along with smaller phrase technology. Any transcriptional errors that result from this process are unintentional.

## 2023-04-09 ENCOUNTER — Other Ambulatory Visit: Payer: Self-pay

## 2023-04-09 ENCOUNTER — Ambulatory Visit: Payer: Managed Care, Other (non HMO) | Admitting: Family Medicine

## 2023-04-09 ENCOUNTER — Encounter: Payer: Self-pay | Admitting: Family Medicine

## 2023-04-09 VITALS — BP 110/84 | HR 53 | Ht 66.0 in | Wt 180.0 lb

## 2023-04-09 DIAGNOSIS — M24851 Other specific joint derangements of right hip, not elsewhere classified: Secondary | ICD-10-CM

## 2023-04-09 DIAGNOSIS — M9903 Segmental and somatic dysfunction of lumbar region: Secondary | ICD-10-CM | POA: Diagnosis not present

## 2023-04-09 DIAGNOSIS — M9904 Segmental and somatic dysfunction of sacral region: Secondary | ICD-10-CM

## 2023-04-09 DIAGNOSIS — M9902 Segmental and somatic dysfunction of thoracic region: Secondary | ICD-10-CM | POA: Diagnosis not present

## 2023-04-09 DIAGNOSIS — M25511 Pain in right shoulder: Secondary | ICD-10-CM

## 2023-04-09 DIAGNOSIS — M9908 Segmental and somatic dysfunction of rib cage: Secondary | ICD-10-CM

## 2023-04-09 DIAGNOSIS — M9901 Segmental and somatic dysfunction of cervical region: Secondary | ICD-10-CM

## 2023-04-09 NOTE — Assessment & Plan Note (Addendum)
 Mild tightness noted.  Nothing severe overall though.  Discussed icing regimen and home exercises.  Patient has been doing very well.  Is crosstraining at the moment.  Increase activity slowly otherwise.  Follow-up again in 8-12 weeks.

## 2023-04-09 NOTE — Patient Instructions (Signed)
 Doing well Tune up today See me in 2-3 months just in case

## 2023-05-24 ENCOUNTER — Other Ambulatory Visit: Payer: Self-pay | Admitting: Family Medicine

## 2023-07-06 NOTE — Progress Notes (Deleted)
  [name] Paul [name] Paul [name] [facility] [name] Phone: [phone] [name] Subjective:    I'm seeing this patient by the request  of:  Paul Joe, MD  CC:   Paul [name]  Paul [name] is a 63 y.o. male coming in with complaint of back and neck pain. OMT 04/09/2023. Also f/u for R shoulder pain. Patient states   Medications patient has been prescribed: Zanaflex  Taking:         Reviewed prior external information including notes and imaging from previsou exam, outside providers and external EMR if available.   As well as notes that were available from care everywhere and other healthcare systems.  Past medical history, social, surgical and family history all reviewed in electronic medical record.  No pertanent information unless stated regarding to the chief complaint.   Past Medical History:  Diagnosis Date   Colon cancer (HCC)    1992   Hypertension    Sebaceous cyst     No Known Allergies   Review of Systems:  No headache, visual changes, nausea, vomiting, diarrhea, constipation, dizziness, abdominal pain, skin rash, fevers, chills, night sweats, weight loss, swollen lymph nodes, body aches, joint swelling, chest pain, shortness of breath, mood changes. POSITIVE muscle aches  Objective  There were no vitals taken for this visit.   General: No apparent distress alert and oriented x3 mood and affect normal, dressed appropriately.  HEENT: Pupils equal, extraocular movements intact  Respiratory: Patient's speak in full sentences and does not appear short of breath  Cardiovascular: No lower extremity edema, non tender, no erythema  Gait MSK:  Back   Osteopathic findings  C2 flexed rotated and side bent right C6 flexed rotated and side bent left T3 extended rotated and side bent right inhaled rib T9 extended rotated and side bent left L2 flexed rotated and side bent right Sacrum right on right       Assessment and Plan:  No  problem-specific Assessment & Plan notes found for this encounter.    Nonallopathic problems  Decision today to treat with OMT was based on Physical Exam  After verbal consent patient was treated with HVLA, ME, FPR techniques in cervical, rib, thoracic, lumbar, and sacral  areas  Patient tolerated the procedure well with improvement in symptoms  Patient given exercises, stretches and lifestyle modifications  See medications in patient instructions if given  Patient will follow up in 4-8 weeks             Note: This dictation was prepared with Dragon dictation along with smaller phrase technology. Any transcriptional errors that result from this process are unintentional.

## 2023-07-16 ENCOUNTER — Ambulatory Visit: Payer: Managed Care, Other (non HMO) | Admitting: Family Medicine

## 2023-08-22 NOTE — Progress Notes (Unsigned)
 [facility] [address] [place] [address] Phone: [phone] Subjective:   Paul [name], am serving as a scribe for Dr. Ronnell Coins.  I'm seeing this patient by the request  of:  Rae Bugler, MD  CC: Back and neck pain follow-up, knee pain follow-up  [name]  Paul [name] is a 63 y.o. male coming in with complaint of back and neck pain. OMT 04/09/2023. No issues with his back.  lso f/u for R shoulder and R hip pain. Patient states that his R knee gets stiff with standing in one spot and after being seated. Some stiffness after runs. Able to run 4 miles. Doing bootcamps for strength. Wearing sleeves and braces.   Shoulder is doing well. Keep hands in peripheral vision. Doing push ups from knees.   R hip pain has subsided.    Medications patient has been prescribed: Zanaflex   Taking:         Reviewed prior external information including notes and imaging from previsou exam, outside providers and external EMR if available.   As well as notes that were available from care everywhere and other healthcare systems.  Past medical history, social, surgical and family history all reviewed in electronic medical record.  No pertanent information unless stated regarding to the chief complaint.   Past Medical History:  Diagnosis Date   Colon cancer (HCC)    1992   Hypertension    Sebaceous cyst     No Known Allergies   Review of Systems:  No headache, visual changes, nausea, vomiting, diarrhea, constipation, dizziness, abdominal pain, skin rash, fevers, chills, night sweats, weight loss, swollen lymph nodes, body aches, joint swelling, chest pain, shortness of breath, mood changes. POSITIVE muscle aches  Objective  Blood pressure (!) 118/92, pulse 76, height 5\' 6"  (1.676 m), weight 177 lb (80.3 kg), SpO2 98%.   General: No apparent distress alert and oriented x3 mood and affect normal, dressed appropriately.  HEENT: Pupils  equal, extraocular movements intact  Respiratory: Patient's speak in full sentences and does not appear short of breath  Cardiovascular: No lower extremity edema, non tender, no erythema  Gait MSK:  Back has had some loss lordosis noted.  Some tenderness to palpation noted.  Patient does have significant tenderness more on the right sacroiliac joint.  Tightness with Veldon German right greater than left. Right knee exam shows tenderness over the medial joint space but more over the lateral joint space  Osteopathic findings  C3 flexed rotated and side bent right T3 extended rotated and side bent right inhaled rib T7 extended rotated and side bent left L2 flexed rotated and side bent right L3 flexed rotated and side Sacrum right on right   Limited muscular skeletal ultrasound was performed and interpreted by Ronnell Coins, M  Limited ultrasound shows some hypoechoic changes of the patellofemoral joint noted.    Assessment and Plan:  Acute medial meniscus tear of right knee Underlying arthritis of the right knee as well as the medial meniscus that does have some degenerative changes.  Discussed icing regimen and home exercises, which activities to do and which ones to avoid.  Increase activity slowly.  Discussed with patient that deep squats could be potentially contributing to some of the discomfort and pain.  Increase activity slowly.    Nonallopathic problems  Decision today to treat with OMT was based on Physical Exam  After verbal consent patient was treated with HVLA, ME, FPR techniques in cervical, rib, thoracic, lumbar,  and sacral  areas  Patient tolerated the procedure well with improvement in symptoms  Patient given exercises, stretches and lifestyle modifications  See medications in patient instructions if given  Patient will follow up in 4-8 weeks     The above documentation has been reviewed and is accurate and complete Paul [name] M Paul Fitzhenry, DO         Note: This  dictation was prepared with Dragon dictation along with smaller phrase technology. Any transcriptional errors that result from this process are unintentional.

## 2023-08-23 ENCOUNTER — Other Ambulatory Visit: Payer: Self-pay

## 2023-08-23 ENCOUNTER — Ambulatory Visit: Admitting: Family Medicine

## 2023-08-23 ENCOUNTER — Encounter: Payer: Self-pay | Admitting: Family Medicine

## 2023-08-23 VITALS — BP 118/92 | HR 76 | Ht 66.0 in | Wt 177.0 lb

## 2023-08-23 DIAGNOSIS — M9904 Segmental and somatic dysfunction of sacral region: Secondary | ICD-10-CM | POA: Diagnosis not present

## 2023-08-23 DIAGNOSIS — M9902 Segmental and somatic dysfunction of thoracic region: Secondary | ICD-10-CM

## 2023-08-23 DIAGNOSIS — S83241D Other tear of medial meniscus, current injury, right knee, subsequent encounter: Secondary | ICD-10-CM

## 2023-08-23 DIAGNOSIS — M9908 Segmental and somatic dysfunction of rib cage: Secondary | ICD-10-CM

## 2023-08-23 DIAGNOSIS — M9903 Segmental and somatic dysfunction of lumbar region: Secondary | ICD-10-CM | POA: Diagnosis not present

## 2023-08-23 DIAGNOSIS — M25511 Pain in right shoulder: Secondary | ICD-10-CM

## 2023-08-23 DIAGNOSIS — M25552 Pain in left hip: Secondary | ICD-10-CM

## 2023-08-23 DIAGNOSIS — M9901 Segmental and somatic dysfunction of cervical region: Secondary | ICD-10-CM | POA: Diagnosis not present

## 2023-08-23 NOTE — Assessment & Plan Note (Signed)
 Underlying arthritis of the right knee as well as the medial meniscus that does have some degenerative changes.  Discussed icing regimen and home exercises, which activities to do and which ones to avoid.  Increase activity slowly.  Discussed with patient that deep squats could be potentially contributing to some of the discomfort and pain.  Increase activity slowly.

## 2023-08-23 NOTE — Assessment & Plan Note (Signed)
 Tightness from the hip that is likely secondary to more of the back.  Discussed icing regimen and home exercises, discussed which activities to do and which ones to avoid.  Increase activity slowly.  Follow-up again in 6 to 8 weeks otherwise.

## 2023-08-23 NOTE — Patient Instructions (Signed)
 Good to see you Overall knee looks good with mild swelling No forward lunges and no deep squats See me again in 3-4 months

## 2023-10-25 ENCOUNTER — Ambulatory Visit: Admitting: Sports Medicine

## 2023-11-01 ENCOUNTER — Encounter: Payer: Self-pay | Admitting: Family Medicine

## 2023-11-01 ENCOUNTER — Other Ambulatory Visit: Payer: Self-pay

## 2023-11-01 ENCOUNTER — Ambulatory Visit: Admitting: Family Medicine

## 2023-11-01 VITALS — BP 118/82 | HR 89 | Ht 66.0 in

## 2023-11-01 DIAGNOSIS — M1711 Unilateral primary osteoarthritis, right knee: Secondary | ICD-10-CM

## 2023-11-01 DIAGNOSIS — S83002A Unspecified subluxation of left patella, initial encounter: Secondary | ICD-10-CM | POA: Diagnosis not present

## 2023-11-01 DIAGNOSIS — M25561 Pain in right knee: Secondary | ICD-10-CM | POA: Diagnosis not present

## 2023-11-01 NOTE — Progress Notes (Signed)
 [name] [address] [place] [address] Phone: [phone] Subjective:   Paul [name] Paul [name], am serving as a scribe for Dr. Arthea Claudene.  I'm seeing this patient by the request  of:  Seabron Lenis, MD  CC: Bilateral knee pain back pain  YEP:Dlagzrupcz  08/23/2023 Underlying arthritis of the right knee as well as the medial meniscus that does have some degenerative changes.  Discussed icing regimen and home exercises, which activities to do and which ones to avoid.  Increase activity slowly.  Discussed with patient that deep squats could be potentially contributing to some of the discomfort and pain.  Increase activity slowly      Update 11/01/2023 Paul [name] is a 63 y.o. male coming in with complaint of R knee, R shoulder and back pain. Patient states that he was hiking a couple of weeks ago when he hyperextended L knee. Pain has improved but he feels like the patella was loose. Back to running and for first 1/2 mile his knee is stiff. During remainder of day his knee will stiffen.   R knee pain is doing much better.      Past Medical History:  Diagnosis Date   Colon cancer (HCC)    1992   Hypertension    Sebaceous cyst    Past Surgical History:  Procedure Laterality Date   COLON SURGERY  1992   KNEE SURGERY  2000   tumor removed     Social History   Socioeconomic History   Marital status: Married    Spouse name: Not on file   Number of children: Not on file   Years of education: Not on file   Highest education level: Not on file  Occupational History   Not on file  Tobacco Use   Smoking status: Never   Smokeless tobacco: Never  Substance and Sexual Activity   Alcohol use: Yes    Alcohol/week: 3.0 standard drinks of alcohol    Types: 3 Cans of beer per week    Comment: per week   Drug use: No   Sexual activity: Never  Other Topics Concern   Not on file  Social History Narrative   Not on file   Social Drivers of Health    Financial Resource Strain: Not on file  Food Insecurity: Not on file  Transportation Needs: Not on file  Physical Activity: Not on file  Stress: Not on file  Social Connections: Unknown (07/07/2022)   Received from [facility]   Social Network    Social Network: Not on file   No Known Allergies No family history on file.   Current Outpatient Medications (Cardiovascular):    cholestyramine (QUESTRAN) 4 GM/DOSE powder, Take 2 g by mouth every morning.    hydrochlorothiazide (MICROZIDE) 12.5 MG capsule, Take 12.5 mg by mouth every morning.    lisinopril (PRINIVIL,ZESTRIL) 20 MG tablet, Take 20 mg by mouth 2 (two) times daily.    Current Outpatient Medications (Analgesics):    aspirin-acetaminophen -caffeine (EXCEDRIN MIGRAINE) 250-250-65 MG per tablet, Take 1 tablet by mouth every 6 (six) hours as needed for pain.   meloxicam  (MOBIC ) 15 MG tablet, Take 1 tablet (15 mg total) by mouth daily.   Current Outpatient Medications (Other):    Diclofenac  Sodium 2 % SOLN, Place 2 g onto the skin 2 (two) times daily.   Diclofenac  Sodium 2 % SOLN, Place 2 g onto the skin 2 (two) times daily.   gabapentin  (NEURONTIN ) 100 MG capsule,  TAKE 2 CAPSULES (200 MG TOTAL) BY MOUTH AT BEDTIME.   tiZANidine  (ZANAFLEX ) 2 MG tablet, TAKE 1 TABLET BY MOUTH EVERYDAY AT BEDTIME   Vitamin D , Ergocalciferol , (DRISDOL ) 1.25 MG (50000 UT) CAPS capsule, Take 1 capsule (50,000 Units total) by mouth every 7 (seven) days.   Reviewed prior external information including notes and imaging from  primary care provider As well as notes that were available from care everywhere and other healthcare systems.  Past medical history, social, surgical and family history all reviewed in electronic medical record.  No pertanent information unless stated regarding to the chief complaint.   Review of Systems:  No headache, visual changes, nausea, vomiting, diarrhea, constipation, dizziness, abdominal pain, skin rash, fevers,  chills, night sweats, weight loss, swollen lymph nodes, body aches, joint swelling, chest pain, shortness of breath, mood changes. POSITIVE muscle aches  Objective  Blood pressure 118/82, pulse 89, height 5' 6 (1.676 m), SpO2 98%.   General: No apparent distress alert and oriented x3 mood and affect normal, dressed appropriately.  HEENT: Pupils equal, extraocular movements intact  Respiratory: Patient's speak in full sentences and does not appear short of breath  Cardiovascular: No lower extremity edema, non tender, no erythema  Bilateral knees do have some arthritic changes noted.  This seems to have more arthritis of the right knee on the lateral compartment.   Limited muscular skeletal ultrasound was performed and interpreted by CLAUDENE HUSSAR, M  Limited ultrasound of the left knee still shows that there is some contusion noted on the posterior superior aspect of the patella. Impression: Resolving patella subluxation   Impression and Recommendations:      The above documentation has been reviewed and is accurate and complete Adalynn Corne M Dejohn Ibarra, DO

## 2023-11-01 NOTE — Assessment & Plan Note (Signed)
 Recurrent at this time.  Discussed potential bracing and compression.  He is making improvement at this time.  Discussed icing regimen.  Avoid certain range of motion's especially the full extension going down stairs or an incline.  Follow-up with me again 8 to 12 weeks

## 2023-11-01 NOTE — Patient Instructions (Signed)
 Take easy for 2 weeks Ice both knees after runs See me early October

## 2023-11-01 NOTE — Assessment & Plan Note (Signed)
 Stable at the moment.  Continue the bracing of the compression.  Will continue to monitor.  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

## 2023-11-19 ENCOUNTER — Ambulatory Visit: Admitting: Family Medicine

## 2023-12-28 NOTE — Progress Notes (Signed)
 Paul [name] [address] [place] [address] Phone: [phone] Subjective:   Paul [name], am serving as a scribe for Dr. Arthea [name].  I'm seeing this patient by the request  of:  Paul Lenis, MD  CC: Right knee pain  YEP:Dlagzrupcz  11/01/2023 Recurrent at this time.  Discussed potential bracing and compression.  He is making improvement at this time.  Discussed icing regimen.  Avoid certain range of motion's especially the full extension going down stairs or an incline.  Follow-up with me again 8 to 12 weeks     Stable at the moment.  Continue the bracing of the compression.  Will continue to monitor.  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     Update 12/31/2023 Paul [name] is a 63 y.o. male coming in with complaint of R knee pain. Patient states running about a month ago and fell and hurt R shoulder. Knee feeling ok. Doing so good running with miles.       Past Medical History:  Diagnosis Date   Colon cancer (HCC)    1992   Hypertension    Sebaceous cyst    Past Surgical History:  Procedure Laterality Date   COLON SURGERY  1992   KNEE SURGERY  2000   tumor removed     Social History   Socioeconomic History   Marital status: Married    Spouse name: Not on file   Number of children: Not on file   Years of education: Not on file   Highest education level: Not on file  Occupational History   Not on file  Tobacco Use   Smoking status: Never   Smokeless tobacco: Never  Substance and Sexual Activity   Alcohol use: Yes    Alcohol/week: 3.0 standard drinks of alcohol    Types: 3 Cans of beer per week    Comment: per week   Drug use: No   Sexual activity: Never  Other Topics Concern   Not on file  Social History Narrative   Not on file   Social Drivers of Health   Financial Resource Strain: Not on file  Food Insecurity: Not on file  Transportation Needs: Not on file  Physical  Activity: Not on file  Stress: Not on file  Social Connections: Unknown (07/07/2022)   Received from [facility]   Social Network    Social Network: Not on file   No Known Allergies No family history on file.   Current Outpatient Medications (Cardiovascular):    cholestyramine (QUESTRAN) 4 GM/DOSE powder, Take 2 g by mouth every morning.    hydrochlorothiazide (MICROZIDE) 12.5 MG capsule, Take 12.5 mg by mouth every morning.    lisinopril (PRINIVIL,ZESTRIL) 20 MG tablet, Take 20 mg by mouth 2 (two) times daily.    Current Outpatient Medications (Analgesics):    aspirin-acetaminophen -caffeine (EXCEDRIN MIGRAINE) 250-250-65 MG per tablet, Take 1 tablet by mouth every 6 (six) hours as needed for pain.   meloxicam  (MOBIC ) 15 MG tablet, Take 1 tablet (15 mg total) by mouth daily.   Current Outpatient Medications (Other):    Diclofenac  Sodium 2 % SOLN, Place 2 g onto the skin 2 (two) times daily.   Diclofenac  Sodium 2 % SOLN, Place 2 g onto the skin 2 (two) times daily.   gabapentin  (NEURONTIN ) 100 MG capsule, TAKE 2 CAPSULES (200 MG TOTAL) BY MOUTH AT BEDTIME.   tiZANidine  (ZANAFLEX ) 2 MG tablet, TAKE 1  TABLET BY MOUTH EVERYDAY AT BEDTIME   Vitamin D , Ergocalciferol , (DRISDOL ) 1.25 MG (50000 UT) CAPS capsule, Take 1 capsule (50,000 Units total) by mouth every 7 (seven) days.   Reviewed prior external information including notes and imaging from  primary care provider As well as notes that were available from care everywhere and other healthcare systems.  Past medical history, social, surgical and family history all reviewed in electronic medical record.  No pertanent information unless stated regarding to the chief complaint.   Review of Systems:  No headache, visual changes, nausea, vomiting, diarrhea, constipation, dizziness, abdominal pain, skin rash, fevers, chills, night sweats, weight loss, swollen lymph nodes, body aches, joint swelling, chest pain, shortness of breath, mood  changes. POSITIVE muscle aches  Objective  There were no vitals taken for this visit.   General: No apparent distress alert and oriented x3 mood and affect normal, dressed appropriately.  HEENT: Pupils equal, extraocular movements intact  Respiratory: Patient's speak in full sentences and does not appear short of breath  Cardiovascular: No lower extremity edema, non tender, no erythema  Bilateral knees do show some varus deformities noted.  Right knee does have trace effusion noted. Right shoulder positive impingement with Neer and Hawkins but 5 out of 5 strength of the rotator cuff noted.  Limited muscular skeletal ultrasound was performed and interpreted by Paul [name], M Limited ultrasound of the knee showed that there is fairly significant lateral arthritic changes noted.  Patient does have some trace hypoechoic changes in the patellofemoral joint right greater than left.  Regarding patient's shoulder significant hypoechoic changes that seems to be a potential hemorrhagic bursitis noted of the shoulder.  Patient's rotator cuff appears to be intact of the supraspinatus and the subscapularis.  Patient does have some acromioclavicular hypoechoic changes consistent with an effusion as well.  Likely bone contusion with no true cortical irregularity noted otherwise. Impression: AC effusion is as well as a hemorrhagic bursitis noted of the right shoulder   [address]7110; 15 additional minutes spent for Therapeutic exercises as stated in above notes.  This included exercises focusing on stretching, strengthening, with significant focus on eccentric aspects.   Long term goals include an improvement in range of motion, strength, endurance as well as avoiding reinjury. Patient's frequency would include in 1-2 times a day, 3-5 times a week for a duration of 6-12 weeks. Shoulder Exercises that included:  Basic scapular stabilization to include adduction and depression of scapula Scaption, focusing on proper  movement and good control Internal and External rotation utilizing a theraband, with elbow tucked at side entire time Rows with theraband  Proper technique shown and discussed handout in great detail with ATC.  All questions were discussed and answered.     Impression and Recommendations:    The above documentation has been reviewed and is accurate and complete Onalee Steinbach M Regine Christian, DO

## 2023-12-31 ENCOUNTER — Ambulatory Visit: Admitting: Family Medicine

## 2023-12-31 ENCOUNTER — Other Ambulatory Visit: Payer: Self-pay

## 2023-12-31 ENCOUNTER — Encounter: Payer: Self-pay | Admitting: Family Medicine

## 2023-12-31 VITALS — BP 128/86 | HR 64 | Ht 66.0 in | Wt 176.0 lb

## 2023-12-31 DIAGNOSIS — M25511 Pain in right shoulder: Secondary | ICD-10-CM

## 2023-12-31 DIAGNOSIS — M170 Bilateral primary osteoarthritis of knee: Secondary | ICD-10-CM | POA: Diagnosis not present

## 2023-12-31 DIAGNOSIS — M1711 Unilateral primary osteoarthritis, right knee: Secondary | ICD-10-CM

## 2023-12-31 NOTE — Assessment & Plan Note (Signed)
 Lateral compartment on the right knee as well as patellofemoral on the left knee.  Hold on any injection but do think it would be beneficial.  Patient is going to decrease duration of runs in the long run.  Not doing it yet when he is going to get through his next marathon.  Discussed more low impact exercises such as swimming or biking would be beneficial.

## 2023-12-31 NOTE — Assessment & Plan Note (Signed)
 Patient has an injury, I do see a hemorrhagic bursitis of the shoulder as well.  Patient did not want to do anything different such as an injection or aspiration.  Patient wants to see how he does with conservative therapy and given some home exercises, discussed icing regimen, follow-up again in 6 to 8 weeks

## 2023-12-31 NOTE — Patient Instructions (Signed)
 Do prescribed exercises at least 3x a week No overhead act Knees are the knees but good to run See you again in 3 months

## 2024-03-26 NOTE — Progress Notes (Signed)
 " Paul [name] [address] [place] [address] Phone: [phone] Subjective:   Paul [name] Paul [name], am serving as a scribe for Dr. Arthea [name].  I'm seeing this patient by the request  of:  Seabron Lenis, MD  CC: Bilateral knee and shoulder pain  YEP:Paul [name]  12/31/2023 Lateral compartment on the right knee as well as patellofemoral on the left knee.  Hold on any injection but do think it would be beneficial.  Patient is going to decrease duration of runs in the long run.  Not doing it yet when he is going to get through his next marathon.  Discussed more low impact exercises such as swimming or biking would be beneficial.     Patient has an injury, I do see a hemorrhagic bursitis of the shoulder as well.  Patient did not want to do anything different such as an injection or aspiration.  Patient wants to see how he does with conservative therapy and given some home exercises, discussed icing regimen, follow-up again in 6 to 8 weeks     Update 04/02/2023 Paul [name] is a 63 y.o. male coming in with complaint of B knee and R shoulder pain.  Patient elected to do more conservative therapy.  Patient states that he still has pain in L knee during the first 1/2 mile. Once he gets warmed up he feels better. Not running as much as he is not training for marathons. Has been wearing braces and sleeves.   Shoulder has been doing well. R knee is doing fine.        Past Medical History:  Diagnosis Date   Colon cancer (HCC)    1992   Hypertension    Sebaceous cyst    Past Surgical History:  Procedure Laterality Date   COLON SURGERY  1992   KNEE SURGERY  2000   tumor removed     Social History   Socioeconomic History   Marital status: Married    Spouse name: Not on file   Number of children: Not on file   Years of education: Not on file   Highest education level: Not on file  Occupational History   Not on file  Tobacco Use   Smoking status: Never    Smokeless tobacco: Never  Substance and Sexual Activity   Alcohol use: Yes    Alcohol/week: 3.0 standard drinks of alcohol    Types: 3 Cans of beer per week    Comment: per week   Drug use: No   Sexual activity: Never  Other Topics Concern   Not on file  Social History Narrative   Not on file   Social Drivers of Health   Tobacco Use: Low Risk (01/19/2024)   Received from [facility]   Patient History    Passive Exposure: Not on file    Smokeless Tobacco Use: Never    Smoking Tobacco Use: Never  Financial Resource Strain: Not on file  Food Insecurity: Not on file  Transportation Needs: Not on file  Physical Activity: Not on file  Stress: Not on file  Social Connections: Not on file  Depression (EYV7-0): Not on file  Alcohol Screen: Not on file  Housing: Not on file  Utilities: Not on file  Health Literacy: Not on file   Allergies[1] No family history on file.  Current Outpatient Medications (Cardiovascular):    cholestyramine (QUESTRAN) 4 GM/DOSE powder, Take 2 g by mouth every morning.    hydrochlorothiazide (MICROZIDE) 12.5  MG capsule, Take 12.5 mg by mouth every morning.    lisinopril (PRINIVIL,ZESTRIL) 20 MG tablet, Take 20 mg by mouth 2 (two) times daily.   Current Outpatient Medications (Analgesics):    aspirin-acetaminophen -caffeine (EXCEDRIN MIGRAINE) 250-250-65 MG per tablet, Take 1 tablet by mouth every 6 (six) hours as needed for pain.   meloxicam  (MOBIC ) 15 MG tablet, Take 1 tablet (15 mg total) by mouth daily.  Current Outpatient Medications (Other):    Diclofenac  Sodium 2 % SOLN, Place 2 g onto the skin 2 (two) times daily.   Diclofenac  Sodium 2 % SOLN, Place 2 g onto the skin 2 (two) times daily.   gabapentin  (NEURONTIN ) 100 MG capsule, TAKE 2 CAPSULES (200 MG TOTAL) BY MOUTH AT BEDTIME.   tiZANidine  (ZANAFLEX ) 2 MG tablet, TAKE 1 TABLET BY MOUTH EVERYDAY AT BEDTIME   Vitamin D , Ergocalciferol , (DRISDOL ) 1.25 MG (50000 UT) CAPS capsule, Take 1  capsule (50,000 Units total) by mouth every 7 (seven) days.   Reviewed prior external information including notes and imaging from  primary care provider As well as notes that were available from care everywhere and other healthcare systems.  Past medical history, social, surgical and family history all reviewed in electronic medical record.  No pertanent information unless stated regarding to the chief complaint.   Review of Systems:  No headache, visual changes, nausea, vomiting, diarrhea, constipation, dizziness, abdominal pain, skin rash, fevers, chills, night sweats, weight loss, swollen lymph nodes, body aches, joint swelling, chest pain, shortness of breath, mood changes. POSITIVE muscle aches  Objective  There were no vitals taken for this visit.   General: No apparent distress alert and oriented x3 mood and affect normal, dressed appropriately.  HEENT: Pupils equal, extraocular movements intact  Respiratory: Patient's speak in full sentences and does not appear short of breath  Cardiovascular: No lower extremity edema, non tender, no erythema  Back exam does have some mild loss of lordosis.  Some tenderness to palpation.  Shoulders fairly unremarkable.  Neck exam does have some mild loss of lordosis but nothing severe.  Osteopathic findings C6 flexed rotated and side bent left T3 extended rotated and side bent right inhaled third rib T7 extended rotated and side bent left L1 flexed rotated and side bent right Sacrum right on right     Impression and Recommendations:     Degenerative arthritis of knee, bilateral Stable overall.  Discussed icing regimen and home exercises, increase activity slowly.  Follow-up again in 6 to 8 weeks.  Neck pain Chronic problem but stable.  Discussed with patient that there is more low back pain.  Discussed home exercises.  Follow-up again in 6 to 12 weeks.    Decision today to treat with OMT was based on Physical Exam  After verbal  consent patient was treated with HVLA, ME, FPR techniques in cervical, thoracic, rib, lumbar and sacral areas, all areas are chronic   Patient tolerated the procedure well with improvement in symptoms  Patient given exercises, stretches and lifestyle modifications  See medications in patient instructions if given  Patient will follow up in 4-8 weeks  The above documentation has been reviewed and is accurate and complete Paul Collar M Jerlene Rockers, DO     [1] No Known Allergies  "

## 2024-04-01 ENCOUNTER — Ambulatory Visit: Admitting: Family Medicine

## 2024-04-01 ENCOUNTER — Encounter: Payer: Self-pay | Admitting: Family Medicine

## 2024-04-01 ENCOUNTER — Other Ambulatory Visit: Payer: Self-pay

## 2024-04-01 VITALS — BP 120/82 | HR 76 | Ht 66.0 in | Wt 166.0 lb

## 2024-04-01 DIAGNOSIS — M9904 Segmental and somatic dysfunction of sacral region: Secondary | ICD-10-CM

## 2024-04-01 DIAGNOSIS — M9902 Segmental and somatic dysfunction of thoracic region: Secondary | ICD-10-CM | POA: Diagnosis not present

## 2024-04-01 DIAGNOSIS — M170 Bilateral primary osteoarthritis of knee: Secondary | ICD-10-CM

## 2024-04-01 DIAGNOSIS — M9901 Segmental and somatic dysfunction of cervical region: Secondary | ICD-10-CM

## 2024-04-01 DIAGNOSIS — M25511 Pain in right shoulder: Secondary | ICD-10-CM

## 2024-04-01 DIAGNOSIS — M542 Cervicalgia: Secondary | ICD-10-CM

## 2024-04-01 DIAGNOSIS — M9903 Segmental and somatic dysfunction of lumbar region: Secondary | ICD-10-CM | POA: Diagnosis not present

## 2024-04-01 DIAGNOSIS — M9908 Segmental and somatic dysfunction of rib cage: Secondary | ICD-10-CM

## 2024-04-01 NOTE — Assessment & Plan Note (Signed)
 Stable overall.  Discussed icing regimen and home exercises, increase activity slowly.  Follow-up again in 6 to 8 weeks.

## 2024-04-01 NOTE — Assessment & Plan Note (Signed)
 Chronic problem but stable.  Discussed with patient that there is more low back pain.  Discussed home exercises.  Follow-up again in 6 to 12 weeks.

## 2024-04-01 NOTE — Patient Instructions (Signed)
 Good to see you See me after next 10k in March Keep with same regimen

## 2024-06-10 ENCOUNTER — Ambulatory Visit: Admitting: Family Medicine
